# Patient Record
Sex: Female | Born: 1985 | Hispanic: Yes | Marital: Married | Smoking: Former smoker
Health system: Southern US, Community
[De-identification: ages and names within clinical notes are randomized; demographics above are authoritative.]

## PROBLEM LIST (undated history)

## (undated) DIAGNOSIS — B009 Herpesviral infection, unspecified: Secondary | ICD-10-CM

## (undated) DIAGNOSIS — Z789 Other specified health status: Secondary | ICD-10-CM

## (undated) DIAGNOSIS — I499 Cardiac arrhythmia, unspecified: Secondary | ICD-10-CM

## (undated) DIAGNOSIS — F419 Anxiety disorder, unspecified: Secondary | ICD-10-CM

## (undated) HISTORY — PX: TONSILLECTOMY: SUR1361

---
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
## 2007-03-10 ENCOUNTER — Emergency Department (HOSPITAL_COMMUNITY): Admission: EM | Admit: 2007-03-10 | Discharge: 2007-03-10 | Payer: Self-pay | Admitting: *Deleted

## 2007-08-25 ENCOUNTER — Inpatient Hospital Stay (HOSPITAL_COMMUNITY): Admission: AD | Admit: 2007-08-25 | Discharge: 2007-08-25 | Payer: Self-pay | Admitting: Gynecology

## 2007-11-17 ENCOUNTER — Ambulatory Visit: Payer: Self-pay | Admitting: Family Medicine

## 2007-11-17 ENCOUNTER — Inpatient Hospital Stay (HOSPITAL_COMMUNITY): Admission: AD | Admit: 2007-11-17 | Discharge: 2007-11-17 | Payer: Self-pay | Admitting: Gynecology

## 2007-12-25 ENCOUNTER — Inpatient Hospital Stay (HOSPITAL_COMMUNITY): Admission: AD | Admit: 2007-12-25 | Discharge: 2007-12-25 | Payer: Self-pay | Admitting: Family Medicine

## 2007-12-25 ENCOUNTER — Ambulatory Visit: Payer: Self-pay | Admitting: Physician Assistant

## 2008-08-28 ENCOUNTER — Emergency Department (HOSPITAL_COMMUNITY): Admission: EM | Admit: 2008-08-28 | Discharge: 2008-08-28 | Payer: Self-pay | Admitting: Emergency Medicine

## 2008-09-22 ENCOUNTER — Emergency Department (HOSPITAL_COMMUNITY): Admission: EM | Admit: 2008-09-22 | Discharge: 2008-09-22 | Payer: Self-pay | Admitting: Emergency Medicine

## 2008-10-18 ENCOUNTER — Emergency Department (HOSPITAL_COMMUNITY): Admission: EM | Admit: 2008-10-18 | Discharge: 2008-10-18 | Payer: Self-pay | Admitting: Emergency Medicine

## 2009-03-16 ENCOUNTER — Inpatient Hospital Stay (HOSPITAL_COMMUNITY): Admission: AD | Admit: 2009-03-16 | Discharge: 2009-03-16 | Payer: Self-pay | Admitting: Family Medicine

## 2009-08-07 ENCOUNTER — Emergency Department (HOSPITAL_COMMUNITY): Admission: EM | Admit: 2009-08-07 | Discharge: 2009-08-07 | Payer: Self-pay | Admitting: Emergency Medicine

## 2009-11-28 ENCOUNTER — Emergency Department (HOSPITAL_COMMUNITY): Admission: EM | Admit: 2009-11-28 | Discharge: 2009-11-28 | Payer: Self-pay | Admitting: Emergency Medicine

## 2010-05-27 LAB — URINE MICROSCOPIC-ADD ON

## 2010-05-27 LAB — WET PREP, GENITAL
Trich, Wet Prep: NONE SEEN
Yeast Wet Prep HPF POC: NONE SEEN

## 2010-05-27 LAB — URINALYSIS, ROUTINE W REFLEX MICROSCOPIC
Glucose, UA: NEGATIVE mg/dL
Protein, ur: NEGATIVE mg/dL

## 2010-05-27 LAB — GC/CHLAMYDIA PROBE AMP, GENITAL: Chlamydia, DNA Probe: NEGATIVE

## 2010-06-16 LAB — GLUCOSE, CAPILLARY

## 2010-07-27 NOTE — Op Note (Signed)
[place]  Patient:    Susan Burgess, Susan Burgess Visit Number: [id number] MRN: [id number]          Service Type: DSU Location: [facility] Attending Physician:  Aundria Mems Dictated by:   Kathy Breach, M.D. Proc. Date: 11/26/00 Admit Date:  11/26/2000                             Operative Report  PREOPERATIVE DIAGNOSIS:  Long-standing eustachian tube dysfunction with middle ear effusion and conductive hearing loss.  PROCEDURE:  Bilateral myringotomy insertion of T-tubes.  POSTOPERATIVE DIAGNOSIS:  Long-standing eustachian tube dysfunction with middle ear effusion and conductive hearing loss.  DESCRIPTION OF PROCEDURE:  The patient had cotton ball in her right ear with drainage from her ear.  Parents stated they had gone to the emergency room in [facility] over the weekend with fever and ear drainage, given Augmentin and Cortisporin drops.  The right ear examination with operating microscope, cloudy mucoid fluid in the ear canal with a lot of maceration of the canal skin and tympanic membrane surface which was completely clean was suctioned. This revealed she had a 1 mm round perforation anterior and inferior quadrant, middle ear space.  Able to suction, and a little more mucoid fluid through it. T-tube was inserted in this perforation site.  Floxin drops were displaced by pneumatic pressure, requiring some effort in pressure to demonstrate retrograde patency of the eustachian tube.  The left ear was then inspected. There was an atrophic anterior inferior portion of the tympanic membrane bulging outward.  Radial myringotomy incision was made through this, and a cloudy mucoid fluid expressed from the ear.  This was thoroughly aspirated clear.  T-tube was inserted in the myringotomy site anterior inferior quadrant.  Floxin drops again displaced readily, demonstrated retrograde patency of the eustachian tube.  The patient tolerated the procedure well,  was taken to the recovery room in stable general condition. Dictated by:   Kathy Breach, M.D. Attending Physician:  Aundria Mems DD:  11/26/00 TD:  11/26/00 Job: [id number]
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
## 2010-08-25 ENCOUNTER — Inpatient Hospital Stay (HOSPITAL_COMMUNITY)
Admission: AD | Admit: 2010-08-25 | Discharge: 2010-08-25 | Disposition: A | Discharge status: Home / Self Care | Payer: Medicaid Other | Source: Ambulatory Visit | Attending: Obstetrics & Gynecology | Admitting: Obstetrics & Gynecology

## 2010-08-25 DIAGNOSIS — O9989 Other specified diseases and conditions complicating pregnancy, childbirth and the puerperium: Secondary | ICD-10-CM

## 2010-08-25 DIAGNOSIS — O99891 Other specified diseases and conditions complicating pregnancy: Secondary | ICD-10-CM | POA: Insufficient documentation

## 2010-08-25 DIAGNOSIS — K1370 Unspecified lesions of oral mucosa: Secondary | ICD-10-CM | POA: Insufficient documentation

## 2010-12-06 LAB — POCT PREGNANCY, URINE: Preg Test, Ur: POSITIVE

## 2010-12-06 LAB — URINALYSIS, ROUTINE W REFLEX MICROSCOPIC
Bilirubin Urine: NEGATIVE
Glucose, UA: NEGATIVE
Leukocytes, UA: NEGATIVE
Nitrite: NEGATIVE
Protein, ur: NEGATIVE
pH: 6.5

## 2010-12-06 LAB — WET PREP, GENITAL
Clue Cells Wet Prep HPF POC: NONE SEEN
Trich, Wet Prep: NONE SEEN
Yeast Wet Prep HPF POC: NONE SEEN

## 2010-12-06 LAB — CBC
Platelets: 196
RDW: 12.3

## 2010-12-06 LAB — ABO/RH: ABO/RH(D): O POS

## 2010-12-06 LAB — URINE MICROSCOPIC-ADD ON

## 2010-12-06 LAB — GC/CHLAMYDIA PROBE AMP, GENITAL: Chlamydia, DNA Probe: NEGATIVE

## 2010-12-06 LAB — HCG, QUANTITATIVE, PREGNANCY: hCG, Beta Chain, Quant, S: 81871 — ABNORMAL HIGH

## 2010-12-10 LAB — URINALYSIS, ROUTINE W REFLEX MICROSCOPIC
Nitrite: NEGATIVE
Protein, ur: NEGATIVE

## 2010-12-12 LAB — URINALYSIS, ROUTINE W REFLEX MICROSCOPIC
Ketones, ur: NEGATIVE
Protein, ur: NEGATIVE
Specific Gravity, Urine: 1.025
Urobilinogen, UA: 0.2

## 2010-12-12 LAB — WET PREP, GENITAL: Trich, Wet Prep: NONE SEEN

## 2010-12-12 LAB — URINE MICROSCOPIC-ADD ON: RBC / HPF: NONE SEEN

## 2010-12-12 LAB — GC/CHLAMYDIA PROBE AMP, GENITAL: GC Probe Amp, Genital: NEGATIVE

## 2011-08-07 ENCOUNTER — Emergency Department (HOSPITAL_COMMUNITY)
Admission: EM | Admit: 2011-08-07 | Discharge: 2011-08-07 | Disposition: A | Discharge status: Home / Self Care | Payer: Medicaid Other | Attending: Emergency Medicine | Admitting: Emergency Medicine

## 2011-08-07 DIAGNOSIS — Z760 Encounter for issue of repeat prescription: Secondary | ICD-10-CM | POA: Insufficient documentation

## 2011-08-07 MED ORDER — VALACYCLOVIR HCL 500 MG PO TABS: 500.0000 mg | ORAL_TABLET | Freq: Two times a day (BID) | ORAL | Status: DC

## 2011-08-07 NOTE — ED Notes (Signed)
Pt needs refill on valacyclovir 500mg  tablets.  Pt reports getting irritation around her lips each summer and needs medication to help suppress symptoms.

## 2011-08-07 NOTE — Discharge Instructions (Signed)
Medication Refill, Emergency Department  We have refilled your medication today as a courtesy to you. It is best for your medical care, however, to take care of getting refills done through your primary caregiver's office. They have your records and can do a better job of follow-up than we can in the emergency department.  On maintenance medications, we often only prescribe enough medications to get you by until you are able to see your regular caregiver. This is a more expensive way to refill medications.  In the future, please plan for refills so that you will not have to use the emergency department for this.  Thank you for your help. Your help allows us to better take care of the daily emergencies that enter our department.  Document Released: 06/14/2003 Document Revised: 02/14/2011 Document Reviewed: 02/25/2005  Exit[employer] Patient Information 2012 [employer].

## 2011-08-07 NOTE — ED Provider Notes (Signed)
History     CSN: [id number]  Arrival date & time 08/07/11  1155   First MD Initiated Contact with Patient 08/07/11 1222      Chief Complaint  Patient presents with  . Medication Refill    (Consider location/radiation/quality/duration/timing/severity/associated sxs/prior treatment) HPI Comments: 76 y who presents for medication refill.  Pt gets outbreak around lips every summer, and the valtrex she takes to suppress is out.  No other complaints, no pain or rash, or fever.  The history is provided by the patient. No language interpreter was used.    No past medical history on file.  No past surgical history on file.  No family history on file.  History  Substance Use Topics  . Smoking status: Not on file  . Smokeless tobacco: Not on file  . Alcohol Use: Not on file    OB History    No data available      Review of Systems  All other systems reviewed and are negative.    Allergies  Review of patient's allergies indicates no known allergies.  Home Medications   Current Outpatient Rx  Name Route Sig Dispense Refill  . VALACYCLOVIR HCL 500 MG PO TABS Oral Take 1 tablet (500 mg total) by mouth 2 (two) times daily. 10 tablet 2    BP 112/70  Pulse 64  Temp(Src) 98.2 F (36.8 C) (Oral)  SpO2 100%  Physical Exam  Nursing note and vitals reviewed. Constitutional: She appears well-developed and well-nourished.  HENT:  Head: Normocephalic.  Eyes: Conjunctivae and EOM are normal.  Neck: Normal range of motion. Neck supple.  Cardiovascular: Normal rate and normal heart sounds.   Pulmonary/Chest: Effort normal and breath sounds normal.  Abdominal: Soft. Bowel sounds are normal.  Neurological: She is alert.  Skin: Skin is warm.    ED Course  Procedures (including critical care time)  Labs Reviewed - No data to display No results found.   1. Medication refill       MDM  Pt here for refill of medication.  No adverse reactions noted before.  Will  refill med.          Chrystine Oiler, MD 08/07/11 1251

## 2011-11-09 ENCOUNTER — Inpatient Hospital Stay (HOSPITAL_COMMUNITY)
Admission: AD | Admit: 2011-11-09 | Discharge: 2011-11-09 | Disposition: A | Discharge status: Home / Self Care | Payer: Self-pay | Source: Ambulatory Visit | Attending: Obstetrics & Gynecology | Admitting: Obstetrics & Gynecology

## 2011-11-09 ENCOUNTER — Encounter (HOSPITAL_COMMUNITY): Payer: Self-pay

## 2011-11-09 ENCOUNTER — Inpatient Hospital Stay (HOSPITAL_COMMUNITY): Payer: Self-pay

## 2011-11-09 DIAGNOSIS — R102 Pelvic and perineal pain: Secondary | ICD-10-CM

## 2011-11-09 DIAGNOSIS — Z30432 Encounter for removal of intrauterine contraceptive device: Secondary | ICD-10-CM | POA: Insufficient documentation

## 2011-11-09 DIAGNOSIS — N949 Unspecified condition associated with female genital organs and menstrual cycle: Secondary | ICD-10-CM | POA: Insufficient documentation

## 2011-11-09 DIAGNOSIS — R109 Unspecified abdominal pain: Secondary | ICD-10-CM | POA: Insufficient documentation

## 2011-11-09 HISTORY — DX: Other specified health status: Z78.9

## 2011-11-09 LAB — URINALYSIS, ROUTINE W REFLEX MICROSCOPIC
Glucose, UA: NEGATIVE mg/dL
Ketones, ur: NEGATIVE mg/dL
Protein, ur: NEGATIVE mg/dL
Specific Gravity, Urine: 1.02 (ref 1.005–1.030)
Urobilinogen, UA: 0.2 mg/dL (ref 0.0–1.0)
pH: 7 (ref 5.0–8.0)

## 2011-11-09 LAB — WET PREP, GENITAL: Trich, Wet Prep: NONE SEEN

## 2011-11-09 MED ORDER — MEDROXYPROGESTERONE ACETATE 150 MG/ML IM SUSP
150.0000 mg | Freq: Once | INTRAMUSCULAR | Status: AC
  Administered 2011-11-09: 150 mg via INTRAMUSCULAR
  Filled 2011-11-09: qty 1

## 2011-11-09 NOTE — MAU Note (Signed)
Patient states she would like to have the Mirena out so she can go back on Depo

## 2011-11-09 NOTE — MAU Note (Signed)
Patient states she has had a Mirena IUD since last October after the birth of her baby. States for about one week she has been having abdominal pain, vaginal pain, nausea, and side pain. Denies any bleeding at this time, passed a clot 1 1/.2 weeks ago. Patient is still breast feeding.

## 2011-11-09 NOTE — MAU Provider Note (Signed)
History     CSN: [id number]  Arrival date and time: 11/09/11 1539   First Provider Initiated Contact with Patient 11/09/11 1625      Chief Complaint  Patient presents with  . Abdominal Pain  . Nausea  . Chills  . Vaginal Pain  . Possible Pregnancy   HPI 26 y.o. G3P3 with pelvic pain and vaginal discomfort. Has mirena in place since October 2012. No bleeding or discharge. Wants Mirena out, wants to restart depo provera. Feels like the Mirena is causing her pain.    Past Medical History  Diagnosis Date  . No pertinent past medical history     Past Surgical History  Procedure Date  . Tonsillectomy     No family history on file.  History  Substance Use Topics  . Smoking status: Former Games developer  . Smokeless tobacco: Not on file  . Alcohol Use: Yes     occasional    Allergies: No Known Allergies  Prescriptions prior to admission  Medication Sig Dispense Refill  . valACYclovir (VALTREX) 500 MG tablet Take 1 tablet (500 mg total) by mouth 2 (two) times daily.  10 tablet  2    Review of Systems  Constitutional: Negative.   Respiratory: Negative.   Cardiovascular: Negative.   Gastrointestinal: Positive for abdominal pain. Negative for nausea, vomiting, diarrhea and constipation.  Genitourinary: Negative for dysuria, urgency, frequency, hematuria and flank pain.       Negative for vaginal bleeding, vaginal discharge, dyspareunia  Musculoskeletal: Negative.   Neurological: Negative.   Psychiatric/Behavioral: Negative.    Physical Exam   Blood pressure 116/73, pulse 93, temperature 98.7 F (37.1 C), temperature source Oral, resp. rate 16, height 5\' 1"  (1.549 m), weight 171 lb (77.565 kg), SpO2 99.00%.  Physical Exam  Nursing note and vitals reviewed. Constitutional: She is oriented to person, place, and time. She appears well-developed and well-nourished. No distress.  HENT:  Head: Normocephalic and atraumatic.  Cardiovascular: Normal rate, regular rhythm and  normal heart sounds.   Respiratory: Effort normal and breath sounds normal. No respiratory distress.  GI: Soft. Bowel sounds are normal. She exhibits no distension and no mass. There is no tenderness. There is no rebound and no guarding.  Genitourinary: There is no rash or lesion on the right labia. There is no rash or lesion on the left labia. Uterus is not deviated, not enlarged, not fixed and not tender. Cervix exhibits no motion tenderness, no discharge and no friability. Right adnexum displays no mass, no tenderness and no fullness. Left adnexum displays tenderness. Left adnexum displays no mass and no fullness. No erythema, tenderness or bleeding around the vagina. No vaginal discharge found.       IUD strings visible protruding from cervical os, with patient's verbal consent, strings grasped with ring forceps and IUD removed without difficulty, patient tolerated well  Neurological: She is alert and oriented to person, place, and time.  Skin: Skin is warm and dry.  Psychiatric: She has a normal mood and affect.    MAU Course  Procedures Results for orders placed during the hospital encounter of 11/09/11 (from the past 24 hour(s))  POCT PREGNANCY, URINE     Status: Normal   Collection Time   11/09/11  4:02 PM      Component Value Range   Preg Test, Ur NEGATIVE  NEGATIVE   US Transvaginal Non-ob  11/09/2011  *RADIOLOGY REPORT*  Clinical Data: Right adnexal and right lower quadrant pain and tenderness; IUD removed today  TRANSABDOMINAL AND TRANSVAGINAL ULTRASOUND OF PELVIS Technique:  Both transabdominal and transvaginal ultrasound examinations of the pelvis were performed. Transabdominal technique was performed for global imaging of the pelvis including uterus, ovaries, adnexal regions, and pelvic cul-de-sac.  It was necessary to proceed with endovaginal exam following the transabdominal exam to visualize the endometrium and ovaries.  Comparison:  03/16/2009  Findings:  Uterus: 5.8 cm length  by 3.3 cm AP by 3.3 cm transverse. Retroverted.  Normal morphology without mass.  Endometrium: 2 mm thick, normal.  No endometrial fluid.  Right ovary:  2.8 x 2.1 x 1.6 cm.  Normal morphology without mass.  Left ovary: 2.4 x 1.3 x 2.2 cm.  Normal morphology without mass.  Other findings: No free pelvic fluid or adnexal masses identified.  IMPRESSION: Normal exam.   Original Report Authenticated By: Lollie Marrow, M.D.    US Pelvis Complete  11/09/2011  *RADIOLOGY REPORT*  Clinical Data: Right adnexal and right lower quadrant pain and tenderness; IUD removed today  TRANSABDOMINAL AND TRANSVAGINAL ULTRASOUND OF PELVIS Technique:  Both transabdominal and transvaginal ultrasound examinations of the pelvis were performed. Transabdominal technique was performed for global imaging of the pelvis including uterus, ovaries, adnexal regions, and pelvic cul-de-sac.  It was necessary to proceed with endovaginal exam following the transabdominal exam to visualize the endometrium and ovaries.  Comparison:  03/16/2009  Findings:  Uterus: 5.8 cm length by 3.3 cm AP by 3.3 cm transverse. Retroverted.  Normal morphology without mass.  Endometrium: 2 mm thick, normal.  No endometrial fluid.  Right ovary:  2.8 x 2.1 x 1.6 cm.  Normal morphology without mass.  Left ovary: 2.4 x 1.3 x 2.2 cm.  Normal morphology without mass.  Other findings: No free pelvic fluid or adnexal masses identified.  IMPRESSION: Normal exam.   Original Report Authenticated By: Lollie Marrow, M.D.      Assessment and Plan  26 y.o. G3P3 with pelvic pain IUD removed per patient request, Depo Provera given in MAU today Follow up for repeat Depo shot in 3 months  Susan Burgess 11/09/2011, 4:27 PM

## 2011-11-12 LAB — GC/CHLAMYDIA PROBE AMP, GENITAL
Chlamydia, DNA Probe: NEGATIVE
GC Probe Amp, Genital: NEGATIVE

## 2012-03-07 ENCOUNTER — Emergency Department (HOSPITAL_COMMUNITY)
Admission: EM | Admit: 2012-03-07 | Discharge: 2012-03-08 | Disposition: A | Discharge status: Home / Self Care | Payer: Medicaid Other | Attending: Emergency Medicine | Admitting: Emergency Medicine

## 2012-03-07 ENCOUNTER — Encounter (HOSPITAL_COMMUNITY): Payer: Self-pay | Admitting: *Deleted

## 2012-03-07 DIAGNOSIS — R6883 Chills (without fever): Secondary | ICD-10-CM | POA: Insufficient documentation

## 2012-03-07 DIAGNOSIS — J029 Acute pharyngitis, unspecified: Secondary | ICD-10-CM | POA: Insufficient documentation

## 2012-03-07 DIAGNOSIS — Z87891 Personal history of nicotine dependence: Secondary | ICD-10-CM | POA: Insufficient documentation

## 2012-03-07 DIAGNOSIS — R059 Cough, unspecified: Secondary | ICD-10-CM | POA: Insufficient documentation

## 2012-03-07 DIAGNOSIS — R109 Unspecified abdominal pain: Secondary | ICD-10-CM | POA: Insufficient documentation

## 2012-03-07 DIAGNOSIS — B349 Viral infection, unspecified: Secondary | ICD-10-CM

## 2012-03-07 DIAGNOSIS — R002 Palpitations: Secondary | ICD-10-CM | POA: Insufficient documentation

## 2012-03-07 DIAGNOSIS — R05 Cough: Secondary | ICD-10-CM | POA: Insufficient documentation

## 2012-03-07 DIAGNOSIS — R197 Diarrhea, unspecified: Secondary | ICD-10-CM | POA: Insufficient documentation

## 2012-03-07 LAB — CBC WITH DIFFERENTIAL/PLATELET
Basophils Absolute: 0 10*3/uL (ref 0.0–0.1)
Basophils Relative: 0 % (ref 0–1)
Eosinophils Absolute: 0.1 10*3/uL (ref 0.0–0.7)
Hemoglobin: 14.5 g/dL (ref 12.0–15.0)
MCHC: 33 g/dL (ref 30.0–36.0)
Monocytes Relative: 9 % (ref 3–12)
Neutro Abs: 7.3 10*3/uL (ref 1.7–7.7)
Neutrophils Relative %: 81 % — ABNORMAL HIGH (ref 43–77)
Platelets: 234 10*3/uL (ref 150–400)

## 2012-03-07 LAB — URINE MICROSCOPIC-ADD ON

## 2012-03-07 LAB — COMPREHENSIVE METABOLIC PANEL
ALT: 25 U/L (ref 0–35)
AST: 22 U/L (ref 0–37)
Albumin: 4.5 g/dL (ref 3.5–5.2)
Alkaline Phosphatase: 113 U/L (ref 39–117)
BUN: 13 mg/dL (ref 6–23)
Chloride: 98 mEq/L (ref 96–112)
Potassium: 4 mEq/L (ref 3.5–5.1)
Sodium: 134 mEq/L — ABNORMAL LOW (ref 135–145)
Total Bilirubin: 0.2 mg/dL — ABNORMAL LOW (ref 0.3–1.2)
Total Protein: 8.3 g/dL (ref 6.0–8.3)

## 2012-03-07 LAB — URINALYSIS, ROUTINE W REFLEX MICROSCOPIC
Bilirubin Urine: NEGATIVE
Glucose, UA: NEGATIVE mg/dL
Ketones, ur: NEGATIVE mg/dL
Nitrite: NEGATIVE
Specific Gravity, Urine: 1.027 (ref 1.005–1.030)
pH: 6 (ref 5.0–8.0)

## 2012-03-07 MED ORDER — SODIUM CHLORIDE 0.9 % IV BOLUS (SEPSIS)
1000.0000 mL | Freq: Once | INTRAVENOUS | Status: AC
  Administered 2012-03-07: 1000 mL via INTRAVENOUS

## 2012-03-07 NOTE — ED Provider Notes (Signed)
History     CSN: [id number]  Arrival date & time 03/07/12  2144   First MD Initiated Contact with Patient 03/07/12 2358      Chief Complaint  Patient presents with  . multiple  complaints      Patient is a 26 y.o. female presenting with vomiting. The history is provided by the patient.  Emesis  This is a new problem. The current episode started yesterday. The problem occurs 2 to 4 times per day. The problem has been gradually worsening. The emesis has an appearance of stomach contents. There has been no fever. Associated symptoms include abdominal pain, chills, cough and diarrhea.  pt reports cough, vomiting, diarrhea (nonbloody) for past day She also reports headache and body aches. She also reports sore throat She also feels palpitations - she reports she has felt that previously but no syncope reported.  She also reports long h/o (over one year) brief left sided CP that comes at random and is not particularly pleuritic or exertional.  She is not having any active CP at this time.  Past Medical History  Diagnosis Date  . No pertinent past medical history     Past Surgical History  Procedure Date  . Tonsillectomy     No family history on file.  History  Substance Use Topics  . Smoking status: Former Games developer  . Smokeless tobacco: Not on file  . Alcohol Use: Yes     Comment: occasional    OB History    Grav Para Term Preterm Abortions TAB SAB Ect Mult Living   3 3        3       Review of Systems  Constitutional: Positive for chills.  Respiratory: Positive for cough.   Gastrointestinal: Positive for vomiting, abdominal pain and diarrhea.  All other systems reviewed and are negative.    Allergies  Review of patient's allergies indicates no known allergies.  Home Medications  No current outpatient prescriptions on file.  BP 120/82  Pulse 140  Temp 98.4 F (36.9 C) (Oral)  Resp 20  SpO2 98% HR improves to 120s while I am in room Physical  Exam CONSTITUTIONAL: Well developed/well nourished HEAD AND FACE: Normocephalic/atraumatic EYES: EOMI/PERRL ENMT: Mucous membranes moist NECK: supple no meningeal signs SPINE:entire spine nontender CV: S1/S2 noted, no murmurs/rubs/gallops noted Chest - mildly tender to palpation, no crepitance LUNGS: Lungs are clear to auscultation bilaterally, no apparent distress ABDOMEN: soft, nontender, no rebound or guarding GU:no cva tenderness NEURO: Pt is awake/alert, moves all extremitiesx4 EXTREMITIES: pulses normal, full ROM SKIN: warm, color normal PSYCH: no abnormalities of mood noted  ED Course  Procedures  Labs Reviewed  CBC WITH DIFFERENTIAL - Abnormal; Notable for the following:    Neutrophils Relative 81 (*)     Lymphocytes Relative 9 (*)     All other components within normal limits  COMPREHENSIVE METABOLIC PANEL - Abnormal; Notable for the following:    Sodium 134 (*)     Glucose, Bld 110 (*)     Total Bilirubin 0.2 (*)     All other components within normal limits  URINALYSIS, ROUTINE W REFLEX MICROSCOPIC - Abnormal; Notable for the following:    Hgb urine dipstick MODERATE (*)     All other components within normal limits  URINE MICROSCOPIC-ADD ON - Abnormal; Notable for the following:    Squamous Epithelial / LPF FEW (*)     All other components within normal limits  TROPONIN I  PREGNANCY, URINE  12:19 AM Pt reporting vomiting/diarrhea/cough for past day.  Will rehydrate and give IV fluids.  EKG shows sinus tach.  Will obtain CXR as well.    1:47 AM Heart rate improved.  Pt well appearing,  Stable for d/c  MDM  Nursing notes including past medical history and social history reviewed and considered in documentation xrays reviewed and considered Labs/vital reviewed and considered        Date: 03/07/2012  Rate: 139  Rhythm: sinus tachycardia  QRS Axis: normal  Intervals: normal  ST/T Wave abnormalities: nonspecific ST changes  Conduction  Disutrbances:none    Joya Gaskins, MD 03/08/12 0148

## 2012-03-07 NOTE — ED Notes (Signed)
The pt has had tachycardia in the past but was never worked for  It. p 144

## 2012-03-07 NOTE — ED Notes (Signed)
All day today she has felt very ill headache chills aching all  Over  n v and diarrhea.  Pressure in frontal  Head coughing throat painful.  lmp  2008

## 2012-03-08 ENCOUNTER — Emergency Department (HOSPITAL_COMMUNITY): Payer: Medicaid Other

## 2012-03-08 MED ORDER — SODIUM CHLORIDE 0.9 % IV BOLUS (SEPSIS)
1000.0000 mL | Freq: Once | INTRAVENOUS | Status: AC
  Administered 2012-03-08: 1000 mL via INTRAVENOUS

## 2012-03-09 ENCOUNTER — Ambulatory Visit: Payer: Medicaid Other | Admitting: Obstetrics and Gynecology

## 2012-05-29 ENCOUNTER — Inpatient Hospital Stay (HOSPITAL_COMMUNITY)
Admission: AD | Admit: 2012-05-29 | Discharge: 2012-05-29 | Disposition: A | Discharge status: Home / Self Care | Payer: Medicaid Other | Source: Ambulatory Visit | Attending: Obstetrics & Gynecology | Admitting: Obstetrics & Gynecology

## 2012-05-29 ENCOUNTER — Encounter (HOSPITAL_COMMUNITY): Payer: Self-pay | Admitting: *Deleted

## 2012-05-29 DIAGNOSIS — N926 Irregular menstruation, unspecified: Secondary | ICD-10-CM

## 2012-05-29 DIAGNOSIS — R109 Unspecified abdominal pain: Secondary | ICD-10-CM | POA: Insufficient documentation

## 2012-05-29 DIAGNOSIS — N912 Amenorrhea, unspecified: Secondary | ICD-10-CM | POA: Insufficient documentation

## 2012-05-29 HISTORY — DX: Herpesviral infection, unspecified: B00.9

## 2012-05-29 LAB — URINE MICROSCOPIC-ADD ON

## 2012-05-29 LAB — URINALYSIS, ROUTINE W REFLEX MICROSCOPIC
Glucose, UA: NEGATIVE mg/dL
Ketones, ur: NEGATIVE mg/dL
Leukocytes, UA: NEGATIVE
Protein, ur: NEGATIVE mg/dL
Urobilinogen, UA: 0.2 mg/dL (ref 0.0–1.0)

## 2012-05-29 LAB — POCT PREGNANCY, URINE: Preg Test, Ur: NEGATIVE

## 2012-05-29 MED ORDER — NORGESTIMATE-ETH ESTRADIOL 0.25-35 MG-MCG PO TABS: 1.0000 | ORAL_TABLET | Freq: Every day | ORAL | Status: DC

## 2012-05-29 NOTE — MAU Note (Signed)
States she has not had a period since she had her last child 6 years ago. Hx Mirena until September 2013. Started Depo the same day mirena removed. Did not continue Depo after first 3 months. No birth control since December 2013. States she gets cramping every month. States she is cramping today and thought she should get "checked out"  for why she does not have periods. Went to [facility] for pregnancies. No doctor now.

## 2012-05-29 NOTE — MAU Provider Note (Signed)
History     CSN: [id number]  Arrival date and time: 05/29/12 1440   First Provider Initiated Contact with Patient 05/29/12 1528      Chief Complaint  Patient presents with  . Abdominal Pain   HPI Ms. Susan Burgess is a 27 y.o. [id number] who presents to MAU today for amenorrhea. The patient states that her first pregnancy was in 2008 and prior to that she had regular periods. She states that between the next two pregnancies she did not ever have a cycle, but still got pregnant twice. After her last child was born she had an IUD placed. After about 1 year the patient states that she was having abdominal pain and bloating from the IUD. She had it removed in 10/2011 and received a Depo provera injection the same day for birth control. Her next Depo injection was due around 02/09/12, but she was unable to get the injection. She has not been on birth control of any kind since. She states that she is concerned that she has not had a period. She does have some cramping and bloating and occasional nausea around the time that her cycle would be, but without any bleeding. She denies abnormal vaginal discharge, vaginal bleeding of any kind or abdominal pain aside from the cyclic cramping. She is still breastfeeding although she has been weaning her daughter from breastfeeding for a while and has had significant decrease in milk production.   OB History   Grav Para Term Preterm Abortions TAB SAB Ect Mult Living   3 3        3       Past Medical History  Diagnosis Date  . No pertinent past medical history   . Herpes simplex     "around her mouth"    Past Surgical History  Procedure Laterality Date  . Tonsillectomy      Family History  Problem Relation Age of Onset  . Diabetes Maternal Aunt   . Diabetes Maternal Grandfather     History  Substance Use Topics  . Smoking status: Former Games developer  . Smokeless tobacco: Not on file  . Alcohol Use: Yes     Comment: occasional    Allergies: No  Known Allergies  No prescriptions prior to admission    Review of Systems  Constitutional: Negative for fever and malaise/fatigue.  Gastrointestinal: Positive for abdominal pain. Negative for nausea, vomiting, diarrhea and constipation.  Genitourinary: Negative for dysuria, urgency and frequency.  Musculoskeletal: Negative for back pain.  Neurological: Negative for dizziness.   Physical Exam   Blood pressure 114/65, pulse 87, temperature 98.7 F (37.1 C), temperature source Oral, resp. rate 16, height 5\' 1"  (1.549 m), weight 177 lb 9.6 oz (80.559 kg), SpO2 99.00%.  Physical Exam  Constitutional: She is oriented to person, place, and time. She appears well-developed and well-nourished.  HENT:  Head: Normocephalic and atraumatic.  Cardiovascular: Normal rate, regular rhythm and normal heart sounds.   Respiratory: Effort normal and breath sounds normal. No respiratory distress.  GI: Soft. Bowel sounds are normal. She exhibits no distension and no mass. There is no tenderness. There is no rebound and no guarding.  Neurological: She is alert and oriented to person, place, and time.  Skin: Skin is warm and dry. No erythema.  Psychiatric: She has a normal mood and affect.   Results for orders placed during the hospital encounter of 05/29/12 (from the past 24 hour(s))  URINALYSIS, ROUTINE W REFLEX MICROSCOPIC     Status:  Abnormal   Collection Time    05/29/12  2:50 PM      Result Value Range   Color, Urine YELLOW  YELLOW   APPearance CLEAR  CLEAR   Specific Gravity, Urine 1.025  1.005 - 1.030   pH 6.0  5.0 - 8.0   Glucose, UA NEGATIVE  NEGATIVE mg/dL   Hgb urine dipstick MODERATE (*) NEGATIVE   Bilirubin Urine NEGATIVE  NEGATIVE   Ketones, ur NEGATIVE  NEGATIVE mg/dL   Protein, ur NEGATIVE  NEGATIVE mg/dL   Urobilinogen, UA 0.2  0.0 - 1.0 mg/dL   Nitrite NEGATIVE  NEGATIVE   Leukocytes, UA NEGATIVE  NEGATIVE  URINE MICROSCOPIC-ADD ON     Status: Abnormal   Collection Time     05/29/12  2:50 PM      Result Value Range   Squamous Epithelial / LPF FEW (*) RARE   RBC / HPF 3-6  <3 RBC/hpf   Urine-Other YEAST    POCT PREGNANCY, URINE     Status: None   Collection Time    05/29/12  3:23 PM      Result Value Range   Preg Test, Ur NEGATIVE  NEGATIVE    MAU Course  Procedures None  MDM Patient plans to stop breastfeeding and has been cautioned to do so prior to starting the OCPs  Assessment and Plan  A: Amenorrhea, secondary to depo provera use  P: Discharge home Rx for Sprintec x 3 months sent to patient's pharmacy Patient will follow-up in GYN clinic in 2 months for follow-up. Labs may be necessary if patient has not resumed cycles while on OCPs and after cessation of breastfeeding Patient may return to MAU as needed or if her condition were to change or worsen  Freddi Starr, PA-C  05/29/2012, 3:31 PM

## 2012-07-08 ENCOUNTER — Ambulatory Visit (INDEPENDENT_AMBULATORY_CARE_PROVIDER_SITE_OTHER): Payer: Medicaid Other | Admitting: Obstetrics and Gynecology

## 2012-07-08 ENCOUNTER — Encounter: Payer: Self-pay | Admitting: Obstetrics and Gynecology

## 2012-07-08 VITALS — BP 123/82 | HR 80 | Temp 97.2°F | Resp 20 | Ht 61.0 in | Wt 179.6 lb

## 2012-07-08 DIAGNOSIS — B009 Herpesviral infection, unspecified: Secondary | ICD-10-CM | POA: Insufficient documentation

## 2012-07-08 DIAGNOSIS — R002 Palpitations: Secondary | ICD-10-CM

## 2012-07-08 DIAGNOSIS — Z30011 Encounter for initial prescription of contraceptive pills: Secondary | ICD-10-CM

## 2012-07-08 DIAGNOSIS — Z309 Encounter for contraceptive management, unspecified: Secondary | ICD-10-CM

## 2012-07-08 DIAGNOSIS — Z3009 Encounter for other general counseling and advice on contraception: Secondary | ICD-10-CM

## 2012-07-08 MED ORDER — VALACYCLOVIR HCL 500 MG PO TABS: 500.0000 mg | ORAL_TABLET | Freq: Two times a day (BID) | ORAL | Status: DC

## 2012-07-08 MED ORDER — NORGESTIMATE-ETH ESTRADIOL 0.25-35 MG-MCG PO TABS: 1.0000 | ORAL_TABLET | Freq: Every day | ORAL | Status: AC

## 2012-07-08 NOTE — Progress Notes (Signed)
Pt here for follow up of MAU visit on 05/29/12- check to see if OCP's are beneficial. Pt also has other concerns and questions about decreased libido and history of chest pain. States she was previously told to have cardiology referral but could not get appt because was told needed a referral. Pt also has back pain- she feels as a result of epidural from 2012 delivery of child.

## 2012-07-08 NOTE — Progress Notes (Signed)
CC: Referral     HPI Susan Burgess is a 27 y.o. [id number]  who presents for Followup of the MA U. Visit 05/29/2012 when she was seen for amenorrhea. At that time she had been breast-feeding her 79-month-old. She was on Depo-Provera until 02/27/2013 when she was last due for injection. In the MA U. She was placed on Sprintec and given a three-month supply. She is taken as directed and LMP was 06/22/2012. She has had no trouble remembering to take the pill and would like to continue. She is concerned about low libido. Denies relationship problems, dryness, dyspareunia. Does endorse stress and being very busy caring for her 3 children. She is concerned that she was told during her last pregnancy ([facility] in [facility]) that she had an irregular heartbeat. She does have longstanding hx of palpitations which are increased during exertion. She describes heart pounding, racing and feeling uncomfortable for several seconds; she also perceives missing beats intermittently. No chest pain or shortness of breath at present. She had an EKG at ED visit recently which revealed sinus tachycardia. She was told by her obstetrician to see a cardiologist, however she is needing a referral to do so. She requests refill on her Valtrex which she takes for fever blisters around her lips.  Past Medical History  Diagnosis Date  . No pertinent past medical history   . Herpes simplex     "around her mouth"    OB History   Grav Para Term Preterm Abortions TAB SAB Ect Mult Living   3 3 3       3      # Outc Date GA Lbr Len/2nd Wgt Sex Del Anes PTL Lv   1 TRM 6/08    F SVD EPI  Yes   2 TRM 12/09    F SVD EPI  Yes   3 TRM 9/12    F SVD EPI  Yes      Past Surgical History  Procedure Laterality Date  . Tonsillectomy      History   Social History  . Marital Status: Single    Spouse Name: N/A    Number of Children: N/A  . Years of Education: N/A   Occupational History  . Not on file.   Social History Main  Topics  . Smoking status: Former Games developer  . Smokeless tobacco: Not on file  . Alcohol Use: Yes     Comment: occasional  . Drug Use: No  . Sexually Active: Not Currently    Birth Control/ Protection: Pill     Comment: Last intercourse January   Other Topics Concern  . Not on file   Social History Narrative  . No narrative on file    No current outpatient prescriptions on file prior to visit.   No current facility-administered medications on file prior to visit.    No Known Allergies  ROS Pertinent items in HPI  PHYSICAL EXAM Filed Vitals:   07/08/12 1403  BP: 123/82  Pulse: 80  Temp: 97.2 F (36.2 C)  Resp: 20   General: Well nourished, well developed female in no acute distress Cardiovascular: Normal rate Respiratory: Normal effort Abdomen: Soft, nontender Back: No CVAT Extremities: No edema Neurologic: Alert and oriented Pelvic done 05/29/12 and not repeated  ASSESSMENT  1. Heart palpitations   2. Contraception management   3. Herpes simplex     PLAN Cards referral. Rx Sprintec and Valtres. Return here in 6 months for F/U and Pap.  See  AVS for patient education.    Medication List       These changes are accurate as of: 07/08/2012  2:48 PM. If you have any questions, ask your nurse or doctor.          TAKE these medications       norgestimate-ethinyl estradiol 0.25-35 MG-MCG tablet  Commonly known as:  ORTHO-CYCLEN,SPRINTEC,PREVIFEM  Take 1 tablet by mouth daily.     valACYclovir 500 MG tablet  Commonly known as:  VALTREX  Take 1 tablet (500 mg total) by mouth 2 (two) times daily.  Started by:  Danae Orleans, CNM          Breanna Mcdaniel Colin Mulders, CNM 07/08/2012 2:40 PM

## 2012-07-08 NOTE — Patient Instructions (Signed)

## 2012-09-25 ENCOUNTER — Encounter (HOSPITAL_COMMUNITY): Payer: Self-pay | Admitting: *Deleted

## 2012-09-25 ENCOUNTER — Inpatient Hospital Stay (HOSPITAL_COMMUNITY)
Admission: AD | Admit: 2012-09-25 | Discharge: 2012-09-25 | Disposition: A | Discharge status: Home / Self Care | Payer: Medicaid Other | Source: Ambulatory Visit | Attending: Obstetrics & Gynecology | Admitting: Obstetrics & Gynecology

## 2012-09-25 DIAGNOSIS — R3915 Urgency of urination: Secondary | ICD-10-CM | POA: Insufficient documentation

## 2012-09-25 DIAGNOSIS — N10 Acute tubulo-interstitial nephritis: Secondary | ICD-10-CM | POA: Insufficient documentation

## 2012-09-25 DIAGNOSIS — M549 Dorsalgia, unspecified: Secondary | ICD-10-CM | POA: Insufficient documentation

## 2012-09-25 HISTORY — DX: Cardiac arrhythmia, unspecified: I49.9

## 2012-09-25 LAB — URINALYSIS, ROUTINE W REFLEX MICROSCOPIC
Nitrite: NEGATIVE
Protein, ur: NEGATIVE mg/dL
Specific Gravity, Urine: 1.02 (ref 1.005–1.030)
Urobilinogen, UA: 0.2 mg/dL (ref 0.0–1.0)

## 2012-09-25 LAB — URINE MICROSCOPIC-ADD ON

## 2012-09-25 LAB — CBC
MCV: 86.4 fL (ref 78.0–100.0)
Platelets: 257 10*3/uL (ref 150–400)
RBC: 4.47 MIL/uL (ref 3.87–5.11)
RDW: 12.2 % (ref 11.5–15.5)
WBC: 10.8 10*3/uL — ABNORMAL HIGH (ref 4.0–10.5)

## 2012-09-25 LAB — POCT PREGNANCY, URINE: Preg Test, Ur: NEGATIVE

## 2012-09-25 MED ORDER — CEFTRIAXONE SODIUM 1 G IJ SOLR
1.0000 g | Freq: Once | INTRAMUSCULAR | Status: AC
  Administered 2012-09-25: 1 g via INTRAMUSCULAR
  Filled 2012-09-25: qty 10

## 2012-09-25 MED ORDER — ONDANSETRON 8 MG PO TBDP
8.0000 mg | ORAL_TABLET | Freq: Once | ORAL | Status: AC
  Administered 2012-09-25: 8 mg via ORAL
  Filled 2012-09-25: qty 1

## 2012-09-25 MED ORDER — ONDANSETRON 4 MG PO TBDP: 4.0000 mg | ORAL_TABLET | ORAL | Status: DC | PRN

## 2012-09-25 MED ORDER — OXYCODONE-ACETAMINOPHEN 5-325 MG PO TABS
2.0000 | ORAL_TABLET | Freq: Once | ORAL | Status: AC
  Administered 2012-09-25: 2 via ORAL
  Filled 2012-09-25: qty 2

## 2012-09-25 MED ORDER — CIPROFLOXACIN HCL 500 MG PO TABS: 500.0000 mg | ORAL_TABLET | Freq: Two times a day (BID) | ORAL | Status: DC

## 2012-09-25 MED ORDER — OXYCODONE-ACETAMINOPHEN 5-325 MG PO TABS: 1.0000 | ORAL_TABLET | ORAL | Status: DC | PRN

## 2012-09-25 MED ORDER — PHENAZOPYRIDINE HCL 100 MG PO TABS
200.0000 mg | ORAL_TABLET | Freq: Once | ORAL | Status: AC
  Administered 2012-09-25: 200 mg via ORAL
  Filled 2012-09-25: qty 2

## 2012-09-25 MED ORDER — PHENAZOPYRIDINE HCL 200 MG PO TABS: 200.0000 mg | ORAL_TABLET | Freq: Three times a day (TID) | ORAL | Status: DC | PRN

## 2012-09-25 NOTE — MAU Note (Addendum)
Pt reports R side back pain since yesterday that is getting progressively worse.

## 2012-09-25 NOTE — MAU Provider Note (Signed)
Chief Complaint: No chief complaint on file.   First Provider Initiated Contact with Patient 09/25/12 0217     SUBJECTIVE HPI: Susan Burgess is a 27 y.o. [id number] female who presents with moderate right mid-back pain since yesterday and urinary urgency and frequency today. Feels as if she almost won' t make it to the bathroom in time and then only urinates a small amount. Also reports mild  nausea, no vomiting. Denies fever, chills, vomiting, low abd pain, hematuria, vaginal discharge, vaginal bleeding. Has not tried anything for the pain.   Past Medical History  Diagnosis Date  . No pertinent past medical history   . 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 Dysrhythmia    OB History   Grav Para Term Preterm Abortions TAB SAB Ect Mult Living   3 3 3       3      # Outc Date GA Lbr Len/2nd Wgt Sex Del Anes PTL Lv   1 TRM 6/08    F SVD EPI  Yes   2 TRM 12/09    F SVD EPI  Yes   3 TRM 9/12    F SVD EPI  Yes     Past Surgical History  Procedure Laterality Date  . Tonsillectomy     History   Social History  . Marital Status: Married    Spouse Name: N/A    Number of Children: N/A  . Years of Education: N/A   Occupational History  . Not on file.   Social History Main Topics  . Smoking status: Former Games developer  . Smokeless tobacco: Not on file  . Alcohol Use: Yes     Comment: occasional  . Drug Use: No  . Sexually Active: Not Currently    Birth Control/ Protection: Pill     Comment: Last intercourse January   Other Topics Concern  . Not on file   Social History Narrative  . No narrative on file   No current facility-administered medications on file prior to encounter.   Current Outpatient Prescriptions on File Prior to Encounter  Medication Sig Dispense Refill  . norgestimate-ethinyl estradiol (ORTHO-CYCLEN,SPRINTEC,PREVIFEM) 0.25-35 MG-MCG tablet Take 1 tablet by mouth daily.  1 Package  11  . valACYclovir (VALTREX) 500 MG tablet Take 1 tablet (500 mg total) by  mouth 2 (two) times daily.  30 tablet  3   No Known Allergies  ROS: Pertinent items in HPI  OBJECTIVE Blood pressure 131/72, temperature 98.1 F (36.7 C), temperature source Oral, resp. rate 18, height 5' (1.524 m), weight 83.462 kg (184 lb). GENERAL: Well-developed, well-nourished female in mild distress, pacing.  HEENT: Normocephalic HEART: normal rate RESP: normal effort ABDOMEN: Soft, non-tender. BACK: Pos right CVAT.  EXTREMITIES: Nontender, no edema NEURO: Alert and oriented SPECULUM EXAM: Deferred.  LAB RESULTS Results for orders placed during the hospital encounter of 09/25/12 (from the past 24 hour(s))  URINALYSIS, ROUTINE W REFLEX MICROSCOPIC     Status: Abnormal   Collection Time    09/25/12  1:15 AM      Result Value Range   Color, Urine YELLOW  YELLOW   APPearance CLEAR  CLEAR   Specific Gravity, Urine 1.020  1.005 - 1.030   pH 7.0  5.0 - 8.0   Glucose, UA NEGATIVE  NEGATIVE mg/dL   Hgb urine dipstick MODERATE (*) NEGATIVE   Bilirubin Urine NEGATIVE  NEGATIVE   Ketones, ur NEGATIVE  NEGATIVE mg/dL   Protein, ur NEGATIVE  NEGATIVE mg/dL   Urobilinogen, UA 0.2  0.0 - 1.0 mg/dL   Nitrite NEGATIVE  NEGATIVE   Leukocytes, UA TRACE (*) NEGATIVE  URINE MICROSCOPIC-ADD ON     Status: Abnormal   Collection Time    09/25/12  1:15 AM      Result Value Range   Squamous Epithelial / LPF RARE  RARE   WBC, UA 3-6  <3 WBC/hpf   RBC / HPF 3-6  <3 RBC/hpf   Bacteria, UA MANY (*) RARE   Urine-Other MUCOUS PRESENT    CBC     Status: Abnormal   Collection Time    09/25/12  1:25 AM      Result Value Range   WBC 10.8 (*) 4.0 - 10.5 K/uL   RBC 4.47  3.87 - 5.11 MIL/uL   Hemoglobin 12.8  12.0 - 15.0 g/dL   HCT 16.1  09.6 - 04.5 %   MCV 86.4  78.0 - 100.0 fL   MCH 28.6  26.0 - 34.0 pg   MCHC 33.2  30.0 - 36.0 g/dL   RDW 40.9  81.1 - 91.4 %   Platelets 257  150 - 400 K/uL  POCT PREGNANCY, URINE     Status: None   Collection Time    09/25/12  1:43 AM      Result Value  Range   Preg Test, Ur NEGATIVE  NEGATIVE    IMAGING No results found.  MAU COURSE Rocephin 1 gm IM, Pyridium, Zofran and Percocet given in MAU. Pt able to keep down pills.   ASSESSMENT 1. Acute pyelonephritis     PLAN Discharge home in stable condition.      Follow-up Information   Follow up with Primary care provider. (As needed if no improvment in 2-3 days)       Follow up with Emergency department. (As needed for fever greater than 100.4 or if unable to keep down antibiotics.)        Medication List         ciprofloxacin 500 MG tablet  Commonly known as:  CIPRO  Take 1 tablet (500 mg total) by mouth 2 (two) times daily.     clonazePAM 0.5 MG tablet  Commonly known as:  KLONOPIN  Take 0.5 mg by mouth 2 (two) times daily as needed for anxiety.     norgestimate-ethinyl estradiol 0.25-35 MG-MCG tablet  Commonly known as:  ORTHO-CYCLEN,SPRINTEC,PREVIFEM  Take 1 tablet by mouth daily.     ondansetron 4 MG disintegrating tablet  Commonly known as:  ZOFRAN-ODT  Take 1 tablet (4 mg total) by mouth every 4 (four) hours as needed for nausea.     oxyCODONE-acetaminophen 5-325 MG per tablet  Commonly known as:  PERCOCET/ROXICET  Take 1-2 tablets by mouth every 4 (four) hours as needed for pain.     phenazopyridine 200 MG tablet  Commonly known as:  PYRIDIUM  Take 1 tablet (200 mg total) by mouth 3 (three) times daily as needed for pain.     valACYclovir 500 MG tablet  Commonly known as:  VALTREX  Take 1 tablet (500 mg total) by mouth 2 (two) times daily.       [place], CNM 09/25/2012  2:23 AM

## 2012-09-27 LAB — URINE CULTURE

## 2012-09-27 NOTE — MAU Provider Note (Signed)
Attestation of Attending Supervision of Advanced Practitioner (CNM/NP): Evaluation and management procedures were performed by the Advanced Practitioner under my supervision and collaboration. I have reviewed the Advanced Practitioner's note and chart, and I agree with the management and plan.  Susan Creed H. 9:03 PM

## 2012-10-01 ENCOUNTER — Emergency Department (HOSPITAL_COMMUNITY)
Admission: EM | Admit: 2012-10-01 | Discharge: 2012-10-02 | Disposition: A | Discharge status: Home / Self Care | Payer: Medicaid Other | Attending: Emergency Medicine | Admitting: Emergency Medicine

## 2012-10-01 ENCOUNTER — Inpatient Hospital Stay (EMERGENCY_DEPARTMENT_HOSPITAL)
Admission: AD | Admit: 2012-10-01 | Discharge: 2012-10-01 | Disposition: A | Discharge status: Home / Self Care | Payer: Medicaid Other | Source: Ambulatory Visit | Attending: Obstetrics & Gynecology | Admitting: Obstetrics & Gynecology

## 2012-10-01 ENCOUNTER — Encounter (HOSPITAL_COMMUNITY): Payer: Self-pay | Admitting: *Deleted

## 2012-10-01 ENCOUNTER — Emergency Department (HOSPITAL_COMMUNITY): Payer: Medicaid Other

## 2012-10-01 DIAGNOSIS — Z79899 Other long term (current) drug therapy: Secondary | ICD-10-CM | POA: Insufficient documentation

## 2012-10-01 DIAGNOSIS — F411 Generalized anxiety disorder: Secondary | ICD-10-CM | POA: Insufficient documentation

## 2012-10-01 DIAGNOSIS — Z3202 Encounter for pregnancy test, result negative: Secondary | ICD-10-CM | POA: Insufficient documentation

## 2012-10-01 DIAGNOSIS — Z8679 Personal history of other diseases of the circulatory system: Secondary | ICD-10-CM | POA: Insufficient documentation

## 2012-10-01 DIAGNOSIS — R319 Hematuria, unspecified: Secondary | ICD-10-CM | POA: Insufficient documentation

## 2012-10-01 DIAGNOSIS — R109 Unspecified abdominal pain: Secondary | ICD-10-CM | POA: Insufficient documentation

## 2012-10-01 DIAGNOSIS — Z8619 Personal history of other infectious and parasitic diseases: Secondary | ICD-10-CM | POA: Insufficient documentation

## 2012-10-01 DIAGNOSIS — N12 Tubulo-interstitial nephritis, not specified as acute or chronic: Secondary | ICD-10-CM | POA: Insufficient documentation

## 2012-10-01 DIAGNOSIS — Z87891 Personal history of nicotine dependence: Secondary | ICD-10-CM | POA: Insufficient documentation

## 2012-10-01 HISTORY — DX: Anxiety disorder, unspecified: F41.9

## 2012-10-01 LAB — CBC WITH DIFFERENTIAL/PLATELET
Basophils Absolute: 0 10*3/uL (ref 0.0–0.1)
Basophils Relative: 0 % (ref 0–1)
Basophils Relative: 0 % (ref 0–1)
Eosinophils Absolute: 0.2 10*3/uL (ref 0.0–0.7)
Eosinophils Absolute: 0.2 10*3/uL (ref 0.0–0.7)
Eosinophils Relative: 2 % (ref 0–5)
HCT: 38.1 % (ref 36.0–46.0)
Hemoglobin: 12.5 g/dL (ref 12.0–15.0)
Lymphocytes Relative: 28 % (ref 12–46)
MCH: 28.7 pg (ref 26.0–34.0)
MCH: 28.5 pg (ref 26.0–34.0)
MCHC: 32.8 g/dL (ref 30.0–36.0)
MCV: 86.3 fL (ref 78.0–100.0)
Monocytes Absolute: 0.5 10*3/uL (ref 0.1–1.0)
Monocytes Relative: 5 % (ref 3–12)
Platelets: 234 10*3/uL (ref 150–400)
RDW: 12.3 % (ref 11.5–15.5)
WBC: 8.4 10*3/uL (ref 4.0–10.5)

## 2012-10-01 LAB — BASIC METABOLIC PANEL
BUN: 13 mg/dL (ref 6–23)
Chloride: 101 mEq/L (ref 96–112)
Creatinine, Ser: 0.58 mg/dL (ref 0.50–1.10)
GFR calc Af Amer: >90 mL/min (ref >90)
GFR calc non Af Amer: >90 mL/min (ref >90)
Glucose, Bld: 93 mg/dL (ref 70–99)

## 2012-10-01 LAB — URINALYSIS, ROUTINE W REFLEX MICROSCOPIC
Bilirubin Urine: NEGATIVE
Ketones, ur: 15 mg/dL — AB
Nitrite: NEGATIVE
Protein, ur: NEGATIVE mg/dL
Urobilinogen, UA: 0.2 mg/dL (ref 0.0–1.0)
pH: 5.5 (ref 5.0–8.0)

## 2012-10-01 LAB — URINE MICROSCOPIC-ADD ON

## 2012-10-01 MED ORDER — KETOROLAC TROMETHAMINE 30 MG/ML IJ SOLN
30.0000 mg | Freq: Once | INTRAMUSCULAR | Status: AC
  Administered 2012-10-01: 30 mg via INTRAVENOUS
  Filled 2012-10-01: qty 1

## 2012-10-01 MED ORDER — ONDANSETRON HCL 4 MG/2ML IJ SOLN
4.0000 mg | Freq: Once | INTRAMUSCULAR | Status: AC
Start: 2012-10-01 — End: 2012-10-01
  Administered 2012-10-01: 4 mg via INTRAVENOUS
  Filled 2012-10-01: qty 2

## 2012-10-01 MED ORDER — MORPHINE SULFATE 4 MG/ML IJ SOLN
4.0000 mg | Freq: Once | INTRAMUSCULAR | Status: AC
  Administered 2012-10-01: 4 mg via INTRAVENOUS
  Filled 2012-10-01: qty 1

## 2012-10-01 MED ORDER — HYDROMORPHONE HCL PF 1 MG/ML IJ SOLN
1.0000 mg | Freq: Once | INTRAMUSCULAR | Status: AC
  Administered 2012-10-01: 1 mg via INTRAVENOUS
  Filled 2012-10-01: qty 1

## 2012-10-01 MED ORDER — SODIUM CHLORIDE 0.9 % IV BOLUS (SEPSIS)
1000.0000 mL | INTRAVENOUS | Status: AC
  Administered 2012-10-01: 1000 mL via INTRAVENOUS

## 2012-10-01 NOTE — ED Notes (Signed)
Pt c/o bilateral flank pain x 2 wks; treated at [facility] and sent to [facility] for further evaluation; blood in urine

## 2012-10-01 NOTE — ED Notes (Signed)
Dr Effie Shy made aware of pt arrival to ER

## 2012-10-01 NOTE — MAU Note (Signed)
Recently seen for UTI on 7/18 in MAU. Now having increased flank pain bilaterally. Denies urinary frequency, urgency, or burning with urination. States may have 1-2 days left of antibiotic. States having chills but no fevers.

## 2012-10-01 NOTE — MAU Note (Signed)
Patient states she was recently seen in MAU with a UTI. States she is now having bilateral flank pain that started yesterday. States she has a couple days left on antibiotic.

## 2012-10-01 NOTE — MAU Provider Note (Signed)
History     CSN: [id number]  Arrival date and time: 10/01/12 1543   None     Chief Complaint  Patient presents with  . Flank Pain   HPI Susan Burgess is 27 y.o. [id number]  presenting with bilateral flank pain that comes an goes after initially feeling better after begin treated here on 09/25/12.  Had chills at 2am and again at 1pm today.  Nausea on her way here.   Denies fever and vomiting.   She was seen here on 7/18 dx with acute pylonephritis.  She was treated in MAU with Rocephin 1mg , pyridium,zofran and percocet. She was given Rx for Cipro.  Urine culture was + for Proteus Mirabilis >100,000 and sensitive to both Rocephin and Cipro.  Her WBC 10.3 on that visit.     Past Medical History  Diagnosis Date  . No pertinent past medical history   . 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 Dysrhythmia   . Anxiety     Past Surgical History  Procedure Laterality Date  . Tonsillectomy      Family History  Problem Relation Age of Onset  . Diabetes Maternal Aunt   . Diabetes Maternal Grandfather   . Asthma Sister     History  Substance Use Topics  . Smoking status: Former Games developer  . Smokeless tobacco: Not on file  . Alcohol Use: Yes     Comment: occasional    Allergies: No Known Allergies  Prescriptions prior to admission  Medication Sig Dispense Refill  . ciprofloxacin (CIPRO) 500 MG tablet Take 1 tablet (500 mg total) by mouth 2 (two) times daily.  20 tablet  0  . clonazePAM (KLONOPIN) 0.5 MG tablet Take 0.5 mg by mouth 2 (two) times daily as needed for anxiety.      . norgestimate-ethinyl estradiol (ORTHO-CYCLEN,SPRINTEC,PREVIFEM) 0.25-35 MG-MCG tablet Take 1 tablet by mouth daily.  1 Package  11  . ondansetron (ZOFRAN-ODT) 4 MG disintegrating tablet Take 1 tablet (4 mg total) by mouth every 4 (four) hours as needed for nausea.  20 tablet  0  . oxyCODONE-acetaminophen (PERCOCET/ROXICET) 5-325 MG per tablet Take 1-2 tablets by mouth every 4 (four) hours as needed for  pain.  20 tablet  0  . phenazopyridine (PYRIDIUM) 200 MG tablet Take 1 tablet (200 mg total) by mouth 3 (three) times daily as needed for pain.  10 tablet  0  . [DISCONTINUED] valACYclovir (VALTREX) 500 MG tablet Take 1 tablet (500 mg total) by mouth 2 (two) times daily.  30 tablet  3    Review of Systems  Constitutional: Positive for chills. Negative for fever.  Gastrointestinal: Positive for nausea. Negative for vomiting and abdominal pain.  Genitourinary: Negative for dysuria, urgency, frequency and hematuria.  Musculoskeletal:       Bilateral flank pain   Physical Exam   Blood pressure 123/80, pulse 81, temperature 97.9 F (36.6 C), temperature source Oral, resp. rate 18, height 5' (1.524 m), weight 182 lb 9.6 oz (82.827 kg), last menstrual period 09/10/2012, SpO2 100.00%.  Physical Exam  Constitutional: She is oriented to person, place, and time. She appears well-developed and well-nourished. No distress.  HENT:  Head: Normocephalic.  Neck: Normal range of motion.  Cardiovascular: Normal rate.   Respiratory: Effort normal.  GI: Soft. She exhibits no distension and no mass. There is no tenderness. There is CVA tenderness (bilateral). There is no rebound and no guarding.  Genitourinary:  Not indicated  Neurological: She is  alert and oriented to person, place, and time.  Skin: Skin is warm and dry.  Psychiatric: She has a normal mood and affect. Her behavior is normal.    MAU Course  Procedures  MDM Discussed HPI, lab results and reviewed last visit with Dr. Jolayne Panther.  Order given to transfer to [facility] for evaluation to r/o kidney stones. Transferred care to [facility]--Dr. Effie Shy accepted transfer. Reported that patient is stable, comfortable and that no medication has been given.  Assessment and Plan  A:  Bilateral Flank Pain  P:  Transfer to [facility] for further evaluation     Patient is not acutely ill and is able to go by private vehicle.  Stephan Nelis,EVE M 10/01/2012, 5:36 PM

## 2012-10-01 NOTE — ED Provider Notes (Signed)
CSN: [id number]     Arrival date & time 10/01/12  1803 History     First MD Initiated Contact with Patient 10/01/12 2131     Chief Complaint  Patient presents with  . Flank Pain   (Consider location/radiation/quality/duration/timing/severity/associated sxs/prior Treatment) HPI Comments: Patient is a 27 year old female with no significant past medical history who presents with a 2 week history of bilateral flank pain. The pain is located in her bilateral flanks and does not radiate. The pain is described as aching and severe. The pain started gradually and progressively worsened since the onset. No alleviating/aggravating factors. The patient has tried nothing for symptoms without relief. Associated symptoms include hematuria. Patient denies fever, headache, NVD, chest pain, SOB, dysuria, constipation, abnormal vaginal bleeding/discharge. Patient was sent from [facility] for further evaluation.      Past Medical History  Diagnosis Date  . No pertinent past medical history   . 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 Dysrhythmia   . Anxiety    Past Surgical History  Procedure Laterality Date  . Tonsillectomy     Family History  Problem Relation Age of Onset  . Diabetes Maternal Aunt   . Diabetes Maternal Grandfather   . Asthma Sister    History  Substance Use Topics  . Smoking status: Former Games developer  . Smokeless tobacco: Not on file  . Alcohol Use: Yes     Comment: occasional   OB History   Grav Para Term Preterm Abortions TAB SAB Ect Mult Living   3 3 3       3      Review of Systems  Genitourinary: Positive for hematuria and flank pain.  All other systems reviewed and are negative.    Allergies  Review of patient's allergies indicates no known allergies.  Home Medications   Current Outpatient Rx  Name  Route  Sig  Dispense  Refill  . ciprofloxacin (CIPRO) 500 MG tablet   Oral   Take 1 tablet (500 mg total) by mouth 2 (two) times daily.   20 tablet  0   . clonazePAM (KLONOPIN) 0.5 MG tablet   Oral   Take 0.5 mg by mouth 2 (two) times daily as needed for anxiety.         . norgestimate-ethinyl estradiol (ORTHO-CYCLEN,SPRINTEC,PREVIFEM) 0.25-35 MG-MCG tablet   Oral   Take 1 tablet by mouth daily.   1 Package   11   . ondansetron (ZOFRAN-ODT) 4 MG disintegrating tablet   Oral   Take 1 tablet (4 mg total) by mouth every 4 (four) hours as needed for nausea.   20 tablet   0   . oxyCODONE-acetaminophen (PERCOCET/ROXICET) 5-325 MG per tablet   Oral   Take 1-2 tablets by mouth every 4 (four) hours as needed for pain.   20 tablet   0   . phenazopyridine (PYRIDIUM) 200 MG tablet   Oral   Take 1 tablet (200 mg total) by mouth 3 (three) times daily as needed for pain.   10 tablet   0    BP 124/78  Pulse 73  Temp(Src) 97.8 F (36.6 C) (Oral)  Resp 20  Wt 183 lb (83.008 kg)  BMI 35.74 kg/m2  SpO2 98%  LMP 09/10/2012 Physical Exam  Nursing note and vitals reviewed. Constitutional: She is oriented to person, place, and time. She appears well-developed and well-nourished. No distress.  HENT:  Head: Normocephalic and atraumatic.  Eyes: Conjunctivae are normal.  Neck:  Normal range of motion.  Cardiovascular: Normal rate and regular rhythm.  Exam reveals no gallop and no friction rub.   No murmur heard. Pulmonary/Chest: Effort normal and breath sounds normal. She has no wheezes. She has no rales. She exhibits no tenderness.  Abdominal: Soft. She exhibits no distension. There is no tenderness. There is no rebound and no guarding.  Genitourinary:  Bilateral CVA tenderness.   Musculoskeletal: Normal range of motion.  Neurological: She is alert and oriented to person, place, and time. Coordination normal.  Speech is goal-oriented. Moves limbs without ataxia.   Skin: Skin is warm and dry.  Psychiatric: She has a normal mood and affect. Her behavior is normal.    ED Course   Procedures (including critical care time)  Labs  Reviewed  URINALYSIS, ROUTINE W REFLEX MICROSCOPIC - Abnormal; Notable for the following:    APPearance CLOUDY (*)    Specific Gravity, Urine 1.034 (*)    Hgb urine dipstick MODERATE (*)    Ketones, ur 15 (*)    Leukocytes, UA SMALL (*)    All other components within normal limits  URINE MICROSCOPIC-ADD ON - Abnormal; Notable for the following:    Squamous Epithelial / LPF MANY (*)    All other components within normal limits  URINE CULTURE  CBC WITH DIFFERENTIAL  BASIC METABOLIC PANEL  PREGNANCY, URINE   Ct Abdomen Pelvis Wo Contrast  10/02/2012   *RADIOLOGY REPORT*  Clinical Data: Bilateral flank pain for 2 weeks.  Blood in the urine.  CT ABDOMEN AND PELVIS WITHOUT CONTRAST  Technique:  Multidetector CT imaging of the abdomen and pelvis was performed following the standard protocol without intravenous contrast.  Comparison: None.  Findings: The lung bases are clear.  Small esophageal hiatal hernia.  The kidneys appear symmetrical in size and shape.  No pyelocaliectasis or ureterectasis.  No renal, ureteral, or bladder stones are identified.  The bladder is decompressed.  The unenhanced appearance of the liver, spleen, gallbladder, pancreas, adrenal glands, abdominal aorta, inferior vena cava, retroperitoneal lymph nodes, stomach, small bowel, and colon are unremarkable.  Small accessory spleens are present.  No free air or free fluid in the abdomen.  Small umbilical hernia containing fat.  Pelvis:  The uterus and ovaries are not enlarged.  No significant pelvic lymphadenopathy.  No free or loculated pelvic fluid collections.  The appendix is normal.  Stool filled rectosigmoid colon without evidence of diverticulitis.  Normal alignment of the lumbar spine.  IMPRESSION: No renal or ureteral stone or obstruction demonstrated.   Original Report Authenticated By: Burman Nieves, M.D.   1. Pyelonephritis     MDM  11:25 PM Urinalysis unremarkable. Labs unremarkable. Patient will have morphine for  pain. Patient will have CT scan abdomen pelvis to rule out kidney stone.   12:41 AM CT scan shows no uretal stone. Patient likely have pyelonephritis. Patient will have IV rocephin and more pain and nausea medications. After patient receives the IV antibiotics, she will be discharged with Bactrim and Tramadol and zofran. Vitals stable and patient afebrile.   Emilia Beck, PA-C 10/02/12 0045

## 2012-10-01 NOTE — ED Notes (Signed)
Patient transported to CT 

## 2012-10-02 MED ORDER — TRAMADOL HCL 50 MG PO TABS: 50.0000 mg | ORAL_TABLET | Freq: Four times a day (QID) | ORAL | Status: DC | PRN

## 2012-10-02 MED ORDER — HYDROMORPHONE HCL PF 1 MG/ML IJ SOLN
1.0000 mg | Freq: Once | INTRAMUSCULAR | Status: AC
  Administered 2012-10-02: 1 mg via INTRAVENOUS
  Filled 2012-10-02: qty 1

## 2012-10-02 MED ORDER — DEXTROSE 5 % IV SOLN
1.0000 g | Freq: Once | INTRAVENOUS | Status: DC
  Filled 2012-10-02: qty 10

## 2012-10-02 MED ORDER — ONDANSETRON HCL 4 MG/2ML IJ SOLN
4.0000 mg | Freq: Once | INTRAMUSCULAR | Status: AC
  Administered 2012-10-02: 4 mg via INTRAVENOUS
  Filled 2012-10-02: qty 2

## 2012-10-02 MED ORDER — ONDANSETRON 4 MG PO TBDP: 4.0000 mg | ORAL_TABLET | Freq: Three times a day (TID) | ORAL | Status: DC | PRN

## 2012-10-02 MED ORDER — SULFAMETHOXAZOLE-TRIMETHOPRIM 800-160 MG PO TABS: 1.0000 | ORAL_TABLET | Freq: Two times a day (BID) | ORAL | Status: AC

## 2012-10-02 NOTE — MAU Provider Note (Signed)
Attestation of Attending Supervision of Advanced Practitioner (PA/CNM/NP): Evaluation and management procedures were performed by the Advanced Practitioner under my supervision and collaboration.  I have reviewed the Advanced Practitioner's note and chart, and I agree with the management and plan.  Persais Ethridge, MD, FACOG Attending Obstetrician & Gynecologist Faculty Practice, [facility]  

## 2012-10-03 LAB — URINE CULTURE
Colony Count: 50000
Special Requests: NORMAL

## 2012-10-04 ENCOUNTER — Encounter (HOSPITAL_COMMUNITY): Payer: Self-pay | Admitting: Emergency Medicine

## 2012-10-04 ENCOUNTER — Emergency Department (HOSPITAL_COMMUNITY): Payer: Medicaid Other

## 2012-10-04 ENCOUNTER — Emergency Department (HOSPITAL_COMMUNITY)
Admission: EM | Admit: 2012-10-04 | Discharge: 2012-10-04 | Disposition: A | Discharge status: Home / Self Care | Payer: Medicaid Other | Attending: Emergency Medicine | Admitting: Emergency Medicine

## 2012-10-04 DIAGNOSIS — Z87891 Personal history of nicotine dependence: Secondary | ICD-10-CM | POA: Insufficient documentation

## 2012-10-04 DIAGNOSIS — Z3202 Encounter for pregnancy test, result negative: Secondary | ICD-10-CM | POA: Insufficient documentation

## 2012-10-04 DIAGNOSIS — Z79899 Other long term (current) drug therapy: Secondary | ICD-10-CM | POA: Insufficient documentation

## 2012-10-04 DIAGNOSIS — R109 Unspecified abdominal pain: Secondary | ICD-10-CM | POA: Insufficient documentation

## 2012-10-04 DIAGNOSIS — Z8619 Personal history of other infectious and parasitic diseases: Secondary | ICD-10-CM | POA: Insufficient documentation

## 2012-10-04 DIAGNOSIS — Z8679 Personal history of other diseases of the circulatory system: Secondary | ICD-10-CM | POA: Insufficient documentation

## 2012-10-04 DIAGNOSIS — R112 Nausea with vomiting, unspecified: Secondary | ICD-10-CM | POA: Insufficient documentation

## 2012-10-04 DIAGNOSIS — F411 Generalized anxiety disorder: Secondary | ICD-10-CM | POA: Insufficient documentation

## 2012-10-04 DIAGNOSIS — R52 Pain, unspecified: Secondary | ICD-10-CM | POA: Insufficient documentation

## 2012-10-04 LAB — URINALYSIS, ROUTINE W REFLEX MICROSCOPIC
Bilirubin Urine: NEGATIVE
Glucose, UA: NEGATIVE mg/dL
Ketones, ur: NEGATIVE mg/dL
Nitrite: NEGATIVE
Protein, ur: NEGATIVE mg/dL

## 2012-10-04 LAB — CBC WITH DIFFERENTIAL/PLATELET
Basophils Absolute: 0 10*3/uL (ref 0.0–0.1)
Basophils Relative: 0 % (ref 0–1)
Eosinophils Absolute: 0.2 10*3/uL (ref 0.0–0.7)
HCT: 36.9 % (ref 36.0–46.0)
MCH: 29.1 pg (ref 26.0–34.0)
MCHC: 33.3 g/dL (ref 30.0–36.0)
Monocytes Absolute: 0.6 10*3/uL (ref 0.1–1.0)
Neutro Abs: 3.4 10*3/uL (ref 1.7–7.7)
RDW: 12.3 % (ref 11.5–15.5)

## 2012-10-04 LAB — COMPREHENSIVE METABOLIC PANEL
AST: 24 U/L (ref 0–37)
Albumin: 3.2 g/dL — ABNORMAL LOW (ref 3.5–5.2)
BUN: 10 mg/dL (ref 6–23)
Calcium: 8.7 mg/dL (ref 8.4–10.5)
Chloride: 101 mEq/L (ref 96–112)
Creatinine, Ser: 0.74 mg/dL (ref 0.50–1.10)
Total Bilirubin: 0.2 mg/dL — ABNORMAL LOW (ref 0.3–1.2)
Total Protein: 6.7 g/dL (ref 6.0–8.3)

## 2012-10-04 LAB — PREGNANCY, URINE: Preg Test, Ur: NEGATIVE

## 2012-10-04 LAB — LIPASE, BLOOD: Lipase: 17 U/L (ref 11–59)

## 2012-10-04 LAB — URINE MICROSCOPIC-ADD ON

## 2012-10-04 MED ORDER — SODIUM CHLORIDE 0.9 % IV SOLN
Freq: Once | INTRAVENOUS | Status: AC
  Administered 2012-10-04: 16:00:00 via INTRAVENOUS

## 2012-10-04 MED ORDER — IOHEXOL 300 MG/ML  SOLN
25.0000 mL | INTRAMUSCULAR | Status: AC
  Administered 2012-10-04: 25 mL via ORAL

## 2012-10-04 MED ORDER — HYDROCODONE-ACETAMINOPHEN 5-325 MG PO TABS: ORAL_TABLET | ORAL | Status: DC

## 2012-10-04 MED ORDER — IOHEXOL 300 MG/ML  SOLN
100.0000 mL | Freq: Once | INTRAMUSCULAR | Status: AC | PRN
  Administered 2012-10-04: 100 mL via INTRAVENOUS

## 2012-10-04 MED ORDER — CYCLOBENZAPRINE HCL 10 MG PO TABS: 10.0000 mg | ORAL_TABLET | Freq: Two times a day (BID) | ORAL | Status: DC | PRN

## 2012-10-04 MED ORDER — MORPHINE SULFATE 4 MG/ML IJ SOLN
4.0000 mg | Freq: Once | INTRAMUSCULAR | Status: AC
  Administered 2012-10-04: 4 mg via INTRAVENOUS
  Filled 2012-10-04: qty 1

## 2012-10-04 MED ORDER — ONDANSETRON HCL 4 MG/2ML IJ SOLN
4.0000 mg | Freq: Once | INTRAMUSCULAR | Status: AC
  Administered 2012-10-04: 4 mg via INTRAVENOUS
  Filled 2012-10-04: qty 2

## 2012-10-04 NOTE — ED Notes (Signed)
Pt. Stated, i went to [facility] 3-4 days ago for kidney infection, given antibiotic and pain medication and the pain is still there.

## 2012-10-04 NOTE — ED Notes (Signed)
Pt finished oral contrast. CT made aware.

## 2012-10-04 NOTE — ED Provider Notes (Signed)
This is a signout from PA up still at shift change: Susan Burgess is a 27 y.o. female complaining of bilateral flank pain worsening over the course of 3 weeks left greater than right radiating into abdomen associated with nausea and vomiting for one day. Patient was treated for a pyelonephritis. Culture grew Proteus and secondary culture is negative. Patient is afebrile, nonseptic appearance with no change in her bowel habits. Patient had stone protocol CT which was negative. Patient is pending CT with contrast.  CT results show no acute abnormalities. Discussed case with attending who agrees with plan and stability to d/c to home.   Pt is hemodynamically stable, appropriate for, and amenable to discharge at this time. Pt verbalized understanding and agrees with care plan. Outpatient follow-up and specific return precautions discussed.    Discharge Medication List as of 10/04/2012  7:51 PM    START taking these medications   Details  cyclobenzaprine (FLEXERIL) 10 MG tablet Take 1 tablet (10 mg total) by mouth 2 (two) times daily as needed for muscle spasms., Starting 10/04/2012, Until Discontinued, Print    HYDROcodone-acetaminophen (NORCO/VICODIN) 5-325 MG per tablet Take 1-2 tablets by mouth every 6 hours as needed for pain., [employer], PA-C 10/04/12 2021

## 2012-10-04 NOTE — ED Notes (Signed)
Contacted lab about urine preg. CT made aware of delay.

## 2012-10-04 NOTE — ED Provider Notes (Signed)
Medical screening examination/treatment/procedure(s) were performed by non-physician practitioner and as supervising physician I was immediately available for consultation/collaboration.   Charles B. Bernette Mayers, MD 10/04/12 [phone]

## 2012-10-04 NOTE — ED Provider Notes (Signed)
CSN: [id number]     Arrival date & time 10/04/12  1356 History     First MD Initiated Contact with Patient 10/04/12 1439     Chief Complaint  Patient presents with  . Flank Pain   (Consider location/radiation/quality/duration/timing/severity/associated sxs/prior Treatment) Patient is a 27 y.o. female presenting with flank pain. The history is provided by the patient. No language interpreter was used.  Flank Pain The current episode started 1 to 4 weeks ago. The problem occurs constantly. The problem has been gradually worsening. Associated symptoms include abdominal pain, nausea and vomiting. Pertinent negatives include no chest pain, chills, coughing, fever, myalgias or rash. Associated symptoms comments: She returns to ED for worsening/progressive bilateral flank pain that started 3 weeks ago. During the time she has had flank pain, she developed urinary symptoms of dysuria and frequency that were diagnosed and treated as UTI with antibiotics and that have resolved without change to the flank pain. The pain continued to worsen without fever, further dysuria or hematuria and she again presented to the ED on 10/01/12. She had negative lab studies and a noncontrast CT that ruled out kidney stones. A review of the lab studies show that previous urine culture showed proteus and a repeat culture subsequent to this showed clearing of any infection. Today, she states that pain on the left is greater and pain has begun to radiate, causing pain in LUQ and LLQ abdomen. She started having N with V yesterday. Still no fever. .    Past Medical History  Diagnosis Date  . No pertinent past medical history   . 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 Dysrhythmia   . Anxiety    Past Surgical History  Procedure Laterality Date  . Tonsillectomy     Family History  Problem Relation Age of Onset  . Diabetes Maternal Aunt   . Diabetes Maternal Grandfather   . Asthma Sister    History  Substance Use  Topics  . Smoking status: Former Games developer  . Smokeless tobacco: Not on file  . Alcohol Use: Yes     Comment: occasional   OB History   Grav Para Term Preterm Abortions TAB SAB Ect Mult Living   3 3 3       3      Review of Systems  Constitutional: Negative for fever and chills.  HENT: Negative.   Respiratory: Negative.  Negative for cough and shortness of breath.   Cardiovascular: Negative.  Negative for chest pain.  Gastrointestinal: Positive for nausea, vomiting and abdominal pain. Negative for diarrhea.  Genitourinary: Positive for flank pain. Negative for dysuria, frequency, hematuria, vaginal bleeding and vaginal discharge.  Musculoskeletal: Negative.  Negative for myalgias.  Skin: Negative.  Negative for rash.  Neurological: Negative.     Allergies  Review of patient's allergies indicates no known allergies.  Home Medications   Current Outpatient Rx  Name  Route  Sig  Dispense  Refill  . clonazePAM (KLONOPIN) 0.5 MG tablet   Oral   Take 0.5 mg by mouth 2 (two) times daily as needed for anxiety.         . norgestimate-ethinyl estradiol (ORTHO-CYCLEN,SPRINTEC,PREVIFEM) 0.25-35 MG-MCG tablet   Oral   Take 1 tablet by mouth daily.   1 Package   11   . sulfamethoxazole-trimethoprim (SEPTRA DS) 800-160 MG per tablet   Oral   Take 1 tablet by mouth every 12 (twelve) hours.   20 tablet   0   . traMADol (  ULTRAM) 50 MG tablet   Oral   Take 1 tablet (50 mg total) by mouth every 6 (six) hours as needed for pain.   15 tablet   0    BP 127/80  Pulse 67  Temp(Src) 98.5 F (36.9 C) (Oral)  Resp 18  SpO2 99%  LMP 09/10/2012 Physical Exam  Constitutional: She is oriented to person, place, and time. She appears well-developed and well-nourished.  HENT:  Head: Normocephalic.  Neck: Normal range of motion. Neck supple.  Cardiovascular: Normal rate and regular rhythm.   Pulmonary/Chest: Effort normal and breath sounds normal.  Abdominal: Soft. Bowel sounds are  normal. There is tenderness. There is no rebound and no guarding.  LUQ and LLQ tenderness to obese abdomen. No guarding or rebound. BS hypoactive but present.   Genitourinary:  Bilateral flank tenderness to palpation. No swelling, rash, discoloration.   Musculoskeletal: Normal range of motion.  There is also midline spinal and paraspinal tenderness.  Neurological: She is alert and oriented to person, place, and time.  Skin: Skin is warm and dry. No rash noted.  Psychiatric: She has a normal mood and affect.    ED Course   Procedures (including critical care time)  Labs Reviewed  URINALYSIS, ROUTINE W REFLEX MICROSCOPIC - Abnormal; Notable for the following:    APPearance CLOUDY (*)    Hgb urine dipstick SMALL (*)    Leukocytes, UA SMALL (*)    All other components within normal limits  URINE MICROSCOPIC-ADD ON - Abnormal; Notable for the following:    Squamous Epithelial / LPF MANY (*)    All other components within normal limits  CBC WITH DIFFERENTIAL  COMPREHENSIVE METABOLIC PANEL  LIPASE, BLOOD  PREGNANCY, URINE   No results found. No diagnosis found.  MDM  Discussed with Dr. Bernette Mayers. It is unclear whether source of pain is flank or associated with the kidneys. Previous CT scan negative as non-contrast study. Pain now radiates causing abdominal pain and tenderness, also now with N, V. Will repeat CT scan with CM for full evaluation.   Arnoldo Hooker, PA-C 10/04/12 1654

## 2012-10-04 NOTE — ED Notes (Signed)
Pt reports bilateral flank pain x 3 weeks. PT seen at [facility] appx 3 days ago, dx home with abx and pain rx. Pt reports initial relief in pain, but returned today with N/V. Denies dysuria.

## 2012-10-05 NOTE — ED Provider Notes (Signed)
Medical screening examination/treatment/procedure(s) were performed by non-physician practitioner and as supervising physician I was immediately available for consultation/collaboration.   Charles B. Bernette Mayers, MD 10/05/12 1413

## 2012-10-06 NOTE — ED Provider Notes (Signed)
Medical screening examination/treatment/procedure(s) were performed by non-physician practitioner and as supervising physician I was immediately available for consultation/collaboration.  Nethra Mehlberg L Humbert Morozov, MD 10/06/12 1524 

## 2012-10-08 ENCOUNTER — Other Ambulatory Visit: Payer: Self-pay | Admitting: Obstetrics & Gynecology

## 2012-11-04 ENCOUNTER — Inpatient Hospital Stay (HOSPITAL_COMMUNITY)
Admission: AD | Admit: 2012-11-04 | Discharge: 2012-11-05 | Disposition: A | Discharge status: Home / Self Care | Payer: Medicaid Other | Source: Ambulatory Visit | Attending: Obstetrics and Gynecology | Admitting: Obstetrics and Gynecology

## 2012-11-04 DIAGNOSIS — R21 Rash and other nonspecific skin eruption: Secondary | ICD-10-CM | POA: Insufficient documentation

## 2012-11-04 DIAGNOSIS — L293 Anogenital pruritus, unspecified: Secondary | ICD-10-CM | POA: Insufficient documentation

## 2012-11-05 NOTE — MAU Note (Signed)
Pt reports rash and itching in upper inner thighs and vaginal area. Symptoms present x 24 hours

## 2012-11-05 NOTE — MAU Note (Signed)
Delay explained to pt, pt is sitting in her sister's room who is also being seen in MAU

## 2013-03-09 ENCOUNTER — Emergency Department (HOSPITAL_COMMUNITY)
Admission: EM | Admit: 2013-03-09 | Discharge: 2013-03-09 | Discharge status: Against Medical Advice | Payer: Medicaid Other | Attending: Emergency Medicine | Admitting: Emergency Medicine

## 2013-03-09 ENCOUNTER — Encounter (HOSPITAL_COMMUNITY): Payer: Self-pay | Admitting: Emergency Medicine

## 2013-03-09 DIAGNOSIS — R21 Rash and other nonspecific skin eruption: Secondary | ICD-10-CM | POA: Insufficient documentation

## 2013-03-09 DIAGNOSIS — Z8619 Personal history of other infectious and parasitic diseases: Secondary | ICD-10-CM | POA: Insufficient documentation

## 2013-03-09 NOTE — ED Notes (Signed)
Pt. reports generalized itchy rashes onset yesterday unrelieved by OTC Benadryl . Respirations unlabored/ airway intact

## 2014-01-10 ENCOUNTER — Encounter (HOSPITAL_COMMUNITY): Payer: Self-pay | Admitting: Emergency Medicine

## 2014-06-09 ENCOUNTER — Emergency Department (HOSPITAL_COMMUNITY)
Admission: EM | Admit: 2014-06-09 | Discharge: 2014-06-09 | Disposition: A | Discharge status: Home / Self Care | Payer: Medicaid Other | Attending: Emergency Medicine | Admitting: Emergency Medicine

## 2014-06-09 ENCOUNTER — Emergency Department (HOSPITAL_COMMUNITY): Payer: Medicaid Other

## 2014-06-09 ENCOUNTER — Encounter (HOSPITAL_COMMUNITY): Payer: Self-pay | Admitting: *Deleted

## 2014-06-09 DIAGNOSIS — Y9389 Activity, other specified: Secondary | ICD-10-CM | POA: Diagnosis not present

## 2014-06-09 DIAGNOSIS — Z79899 Other long term (current) drug therapy: Secondary | ICD-10-CM | POA: Insufficient documentation

## 2014-06-09 DIAGNOSIS — Z8619 Personal history of other infectious and parasitic diseases: Secondary | ICD-10-CM | POA: Diagnosis not present

## 2014-06-09 DIAGNOSIS — S3992XA Unspecified injury of lower back, initial encounter: Secondary | ICD-10-CM | POA: Insufficient documentation

## 2014-06-09 DIAGNOSIS — Y92410 Unspecified street and highway as the place of occurrence of the external cause: Secondary | ICD-10-CM | POA: Diagnosis not present

## 2014-06-09 DIAGNOSIS — Z87891 Personal history of nicotine dependence: Secondary | ICD-10-CM | POA: Insufficient documentation

## 2014-06-09 DIAGNOSIS — Y998 Other external cause status: Secondary | ICD-10-CM | POA: Diagnosis not present

## 2014-06-09 DIAGNOSIS — F419 Anxiety disorder, unspecified: Secondary | ICD-10-CM | POA: Insufficient documentation

## 2014-06-09 DIAGNOSIS — S161XXA Strain of muscle, fascia and tendon at neck level, initial encounter: Secondary | ICD-10-CM | POA: Diagnosis not present

## 2014-06-09 DIAGNOSIS — S199XXA Unspecified injury of neck, initial encounter: Secondary | ICD-10-CM | POA: Diagnosis present

## 2014-06-09 MED ORDER — HYDROCODONE-ACETAMINOPHEN 5-325 MG PO TABS: 1.0000 | ORAL_TABLET | Freq: Four times a day (QID) | ORAL | Status: DC | PRN

## 2014-06-09 NOTE — Discharge Instructions (Signed)
Cervical Strain and Sprain (Whiplash) °with Rehab °Cervical strain and sprain are injuries that commonly occur with "whiplash" injuries. Whiplash occurs when the neck is forcefully whipped backward or forward, such as during a motor vehicle accident or during contact sports. The muscles, ligaments, tendons, discs, and nerves of the neck are susceptible to injury when this occurs. °RISK FACTORS °Risk of having a whiplash injury increases if: °· Osteoarthritis of the spine. °· Situations that make head or neck accidents or trauma more likely. °· High-risk sports (football, rugby, wrestling, hockey, auto racing, gymnastics, diving, contact karate, or boxing). °· Poor strength and flexibility of the neck. °· Previous neck injury. °· Poor tackling technique. °· Improperly fitted or padded equipment. °SYMPTOMS  °· Pain or stiffness in the front or back of neck or both. °· Symptoms may present immediately or up to 24 hours after injury. °· Dizziness, headache, nausea, and vomiting. °· Muscle spasm with soreness and stiffness in the neck. °· Tenderness and swelling at the injury site. °PREVENTION °· Learn and use proper technique (avoid tackling with the head, spearing, and head-butting; use proper falling techniques to avoid landing on the head). °· Warm up and stretch properly before activity. °· Maintain physical fitness: °· Strength, flexibility, and endurance. °· Cardiovascular fitness. °· Wear properly fitted and padded protective equipment, such as padded soft collars, for participation in contact sports. °PROGNOSIS  °Recovery from cervical strain and sprain injuries is dependent on the extent of the injury. These injuries are usually curable in 1 week to 3 months with appropriate treatment.  °RELATED COMPLICATIONS  °· Temporary numbness and weakness may occur if the nerve roots are damaged, and this may persist until the nerve has completely healed. °· Chronic pain due to frequent recurrence of  symptoms. °· Prolonged healing, especially if activity is resumed too soon (before complete recovery). °TREATMENT  °Treatment initially involves the use of ice and medication to help reduce pain and inflammation. It is also important to perform strengthening and stretching exercises and modify activities that worsen symptoms so the injury does not get worse. These exercises may be performed at home or with a therapist. For patients who experience severe symptoms, a soft, padded collar may be recommended to be worn around the neck.  °Improving your posture may help reduce symptoms. Posture improvement includes pulling your chin and abdomen in while sitting or standing. If you are sitting, sit in a firm chair with your buttocks against the back of the chair. While sleeping, try replacing your pillow with a small towel rolled to 2 inches in diameter, or use a cervical pillow or soft cervical collar. Poor sleeping positions delay healing.  °For patients with nerve root damage, which causes numbness or weakness, the use of a cervical traction apparatus may be recommended. Surgery is rarely necessary for these injuries. However, cervical strain and sprains that are present at birth (congenital) may require surgery. °MEDICATION  °· If pain medication is necessary, nonsteroidal anti-inflammatory medications, such as aspirin and ibuprofen, or other minor pain relievers, such as acetaminophen, are often recommended. °· Do not take pain medication for 7 days before surgery. °· Prescription pain relievers may be given if deemed necessary by your caregiver. Use only as directed and only as much as you need. °HEAT AND COLD:  °· Cold treatment (icing) relieves pain and reduces inflammation. Cold treatment should be applied for 10 to 15 minutes every 2 to 3 hours for inflammation and pain and immediately after any activity that aggravates   your symptoms. Use ice packs or an ice massage. °· Heat treatment may be used prior to  performing the stretching and strengthening activities prescribed by your caregiver, physical therapist, or athletic trainer. Use a heat pack or a warm soak. °SEEK MEDICAL CARE IF:  °· Symptoms get worse or do not improve in 2 weeks despite treatment. °· New, unexplained symptoms develop (drugs used in treatment may produce side effects). °EXERCISES °RANGE OF MOTION (ROM) AND STRETCHING EXERCISES - Cervical Strain and Sprain °These exercises may help you when beginning to rehabilitate your injury. In order to successfully resolve your symptoms, you must improve your posture. These exercises are designed to help reduce the forward-head and rounded-shoulder posture which contributes to this condition. Your symptoms may resolve with or without further involvement from your physician, physical therapist or athletic trainer. While completing these exercises, remember:  °· Restoring tissue flexibility helps normal motion to return to the joints. This allows healthier, less painful movement and activity. °· An effective stretch should be held for at least 20 seconds, although you may need to begin with shorter hold times for comfort. °· A stretch should never be painful. You should only feel a gentle lengthening or release in the stretched tissue. °STRETCH- Axial Extensors °· Lie on your back on the floor. You may bend your knees for comfort. Place a rolled-up hand towel or dish towel, about 2 inches in diameter, under the part of your head that makes contact with the floor. °· Gently tuck your chin, as if trying to make a "double chin," until you feel a gentle stretch at the base of your head. °· Hold __________ seconds. °Repeat __________ times. Complete this exercise __________ times per day.  °STRETCH - Axial Extension  °· Stand or sit on a firm surface. Assume a good posture: chest up, shoulders drawn back, abdominal muscles slightly tense, knees unlocked (if standing) and feet hip width apart. °· Slowly retract your  chin so your head slides back and your chin slightly lowers. Continue to look straight ahead. °· You should feel a gentle stretch in the back of your head. Be certain not to feel an aggressive stretch since this can cause headaches later. °· Hold for __________ seconds. °Repeat __________ times. Complete this exercise __________ times per day. °STRETCH - Cervical Side Bend  °· Stand or sit on a firm surface. Assume a good posture: chest up, shoulders drawn back, abdominal muscles slightly tense, knees unlocked (if standing) and feet hip width apart. °· Without letting your nose or shoulders move, slowly tip your right / left ear to your shoulder until your feel a gentle stretch in the muscles on the opposite side of your neck. °· Hold __________ seconds. °Repeat __________ times. Complete this exercise __________ times per day. °STRETCH - Cervical Rotators  °· Stand or sit on a firm surface. Assume a good posture: chest up, shoulders drawn back, abdominal muscles slightly tense, knees unlocked (if standing) and feet hip width apart. °· Keeping your eyes level with the ground, slowly turn your head until you feel a gentle stretch along the back and opposite side of your neck. °· Hold __________ seconds. °Repeat __________ times. Complete this exercise __________ times per day. °RANGE OF MOTION - Neck Circles  °· Stand or sit on a firm surface. Assume a good posture: chest up, shoulders drawn back, abdominal muscles slightly tense, knees unlocked (if standing) and feet hip width apart. °· Gently roll your head down and around from the   back of one shoulder to the back of the other. The motion should never be forced or painful. °· Repeat the motion 10-20 times, or until you feel the neck muscles relax and loosen. °Repeat __________ times. Complete the exercise __________ times per day. °STRENGTHENING EXERCISES - Cervical Strain and Sprain °These exercises may help you when beginning to rehabilitate your injury. They may  resolve your symptoms with or without further involvement from your physician, physical therapist, or athletic trainer. While completing these exercises, remember:  °· Muscles can gain both the endurance and the strength needed for everyday activities through controlled exercises. °· Complete these exercises as instructed by your physician, physical therapist, or athletic trainer. Progress the resistance and repetitions only as guided. °· You may experience muscle soreness or fatigue, but the pain or discomfort you are trying to eliminate should never worsen during these exercises. If this pain does worsen, stop and make certain you are following the directions exactly. If the pain is still present after adjustments, discontinue the exercise until you can discuss the trouble with your clinician. °STRENGTH - Cervical Flexors, Isometric °· Face a wall, standing about 6 inches away. Place a small pillow, a ball about 6-8 inches in diameter, or a folded towel between your forehead and the wall. °· Slightly tuck your chin and gently push your forehead into the soft object. Push only with mild to moderate intensity, building up tension gradually. Keep your jaw and forehead relaxed. °· Hold 10 to 20 seconds. Keep your breathing relaxed. °· Release the tension slowly. Relax your neck muscles completely before you start the next repetition. °Repeat __________ times. Complete this exercise __________ times per day. °STRENGTH- Cervical Lateral Flexors, Isometric  °· Stand about 6 inches away from a wall. Place a small pillow, a ball about 6-8 inches in diameter, or a folded towel between the side of your head and the wall. °· Slightly tuck your chin and gently tilt your head into the soft object. Push only with mild to moderate intensity, building up tension gradually. Keep your jaw and forehead relaxed. °· Hold 10 to 20 seconds. Keep your breathing relaxed. °· Release the tension slowly. Relax your neck muscles completely  before you start the next repetition. °Repeat __________ times. Complete this exercise __________ times per day. °STRENGTH - Cervical Extensors, Isometric  °· Stand about 6 inches away from a wall. Place a small pillow, a ball about 6-8 inches in diameter, or a folded towel between the back of your head and the wall. °· Slightly tuck your chin and gently tilt your head back into the soft object. Push only with mild to moderate intensity, building up tension gradually. Keep your jaw and forehead relaxed. °· Hold 10 to 20 seconds. Keep your breathing relaxed. °· Release the tension slowly. Relax your neck muscles completely before you start the next repetition. °Repeat __________ times. Complete this exercise __________ times per day. °POSTURE AND BODY MECHANICS CONSIDERATIONS - Cervical Strain and Sprain °Keeping correct posture when sitting, standing or completing your activities will reduce the stress put on different body tissues, allowing injured tissues a chance to heal and limiting painful experiences. The following are general guidelines for improved posture. Your physician or physical therapist will provide you with any instructions specific to your needs. While reading these guidelines, remember: °· The exercises prescribed by your provider will help you have the flexibility and strength to maintain correct postures. °· The correct posture provides the optimal environment for your joints to   work. All of your joints have less wear and tear when properly supported by a spine with good posture. This means you will experience a healthier, less painful body. °· Correct posture must be practiced with all of your activities, especially prolonged sitting and standing. Correct posture is as important when doing repetitive low-stress activities (typing) as it is when doing a single heavy-load activity (lifting). °PROLONGED STANDING WHILE SLIGHTLY LEANING FORWARD °When completing a task that requires you to lean  forward while standing in one place for a long time, place either foot up on a stationary 2- to 4-inch high object to help maintain the best posture. When both feet are on the ground, the low back tends to lose its slight inward curve. If this curve flattens (or becomes too large), then the back and your other joints will experience too much stress, fatigue more quickly, and can cause pain.  °RESTING POSITIONS °Consider which positions are most painful for you when choosing a resting position. If you have pain with flexion-based activities (sitting, bending, stooping, squatting), choose a position that allows you to rest in a less flexed posture. You would want to avoid curling into a fetal position on your side. If your pain worsens with extension-based activities (prolonged standing, working overhead), avoid resting in an extended position such as sleeping on your stomach. Most people will find more comfort when they rest with their spine in a more neutral position, neither too rounded nor too arched. Lying on a non-sagging bed on your side with a pillow between your knees, or on your back with a pillow under your knees will often provide some relief. Keep in mind, being in any one position for a prolonged period of time, no matter how correct your posture, can still lead to stiffness. °WALKING °Walk with an upright posture. Your ears, shoulders, and hips should all line up. °OFFICE WORK °When working at a desk, create an environment that supports good, upright posture. Without extra support, muscles fatigue and lead to excessive strain on joints and other tissues. °CHAIR: °· A chair should be able to slide under your desk when your back makes contact with the back of the chair. This allows you to work closely. °· The chair's height should allow your eyes to be level with the upper part of your monitor and your hands to be slightly lower than your elbows. °· Body position: °¨ Your feet should make contact with the  floor. If this is not possible, use a foot rest. °¨ Keep your ears over your shoulders. This will reduce stress on your neck and low back. °Document Released: 02/25/2005 Document Revised: 07/12/2013 Document Reviewed: 06/09/2008 °[employer]® Patient Information ©2015 [employer]. This information is not intended to replace advice given to you by your health care provider. Make sure you discuss any questions you have with your health care provider. °Motor Vehicle Collision °It is common to have multiple bruises and sore muscles after a motor vehicle collision (MVC). These tend to feel worse for the first 24 hours. You may have the most stiffness and soreness over the first several hours. You may also feel worse when you wake up the first morning after your collision. After this point, you will usually begin to improve with each day. The speed of improvement often depends on the severity of the collision, the number of injuries, and the location and nature of these injuries. °HOME CARE INSTRUCTIONS °· Put ice on the injured area. °¨ Put ice in a   plastic bag. °¨ Place a towel between your skin and the bag. °¨ Leave the ice on for 15-20 minutes, 3-4 times a day, or as directed by your health care provider. °· Drink enough fluids to keep your urine clear or pale yellow. Do not drink alcohol. °· Take a warm shower or bath once or twice a day. This will increase blood flow to sore muscles. °· You may return to activities as directed by your caregiver. Be careful when lifting, as this may aggravate neck or back pain. °· Only take over-the-counter or prescription medicines for pain, discomfort, or fever as directed by your caregiver. Do not use aspirin. This may increase bruising and bleeding. °SEEK IMMEDIATE MEDICAL CARE IF: °· You have numbness, tingling, or weakness in the arms or legs. °· You develop severe headaches not relieved with medicine. °· You have severe neck pain, especially tenderness in the middle of the back  of your neck. °· You have changes in bowel or bladder control. °· There is increasing pain in any area of the body. °· You have shortness of breath, light-headedness, dizziness, or fainting. °· You have chest pain. °· You feel sick to your stomach (nauseous), throw up (vomit), or sweat. °· You have increasing abdominal discomfort. °· There is blood in your urine, stool, or vomit. °· You have pain in your shoulder (shoulder strap areas). °· You feel your symptoms are getting worse. °MAKE SURE YOU: °· Understand these instructions. °· Will watch your condition. °· Will get help right away if you are not doing well or get worse. °Document Released: 02/25/2005 Document Revised: 07/12/2013 Document Reviewed: 07/25/2010 °[employer]® Patient Information ©2015 [employer]. This information is not intended to replace advice given to you by your health care provider. Make sure you discuss any questions you have with your health care provider. ° °
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
## 2014-06-09 NOTE — ED Notes (Signed)
MD at bedside.EDPA ROBERT PRESENT SPEAKING WITH PT
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
## 2014-11-25 ENCOUNTER — Encounter (HOSPITAL_COMMUNITY): Payer: Self-pay | Admitting: Emergency Medicine

## 2014-11-25 ENCOUNTER — Emergency Department (HOSPITAL_COMMUNITY)
Admission: EM | Admit: 2014-11-25 | Discharge: 2014-11-25 | Disposition: A | Discharge status: Home / Self Care | Payer: Medicaid Other | Attending: Emergency Medicine | Admitting: Emergency Medicine

## 2014-11-25 DIAGNOSIS — F419 Anxiety disorder, unspecified: Secondary | ICD-10-CM | POA: Insufficient documentation

## 2014-11-25 DIAGNOSIS — Z87891 Personal history of nicotine dependence: Secondary | ICD-10-CM | POA: Diagnosis not present

## 2014-11-25 DIAGNOSIS — Z8679 Personal history of other diseases of the circulatory system: Secondary | ICD-10-CM | POA: Insufficient documentation

## 2014-11-25 DIAGNOSIS — Z8619 Personal history of other infectious and parasitic diseases: Secondary | ICD-10-CM | POA: Insufficient documentation

## 2014-11-25 DIAGNOSIS — K0510 Chronic gingivitis, plaque induced: Secondary | ICD-10-CM | POA: Diagnosis not present

## 2014-11-25 DIAGNOSIS — Z79899 Other long term (current) drug therapy: Secondary | ICD-10-CM | POA: Insufficient documentation

## 2014-11-25 DIAGNOSIS — R21 Rash and other nonspecific skin eruption: Secondary | ICD-10-CM | POA: Diagnosis present

## 2014-11-25 MED ORDER — TRAMADOL HCL 50 MG PO TABS: 50.0000 mg | ORAL_TABLET | Freq: Four times a day (QID) | ORAL | Status: DC

## 2014-11-25 MED ORDER — VALACYCLOVIR HCL 1 G PO TABS: 1000.0000 mg | ORAL_TABLET | Freq: Three times a day (TID) | ORAL | Status: AC

## 2014-11-25 MED ORDER — TRAMADOL HCL 50 MG PO TABS: 50.0000 mg | ORAL_TABLET | Freq: Four times a day (QID) | ORAL | Status: AC

## 2014-11-25 NOTE — Discharge Instructions (Signed)
Stomatitis °Stomatitis is an inflammation of the mucous lining of the mouth. It can affect part of the mouth or the whole mouth. The intensity of symptoms can range from mild to severe. It can affect your cheek, teeth, gums, lips, or tongue. In almost all cases, the lining of the mouth becomes swollen, red, and painful. Painful ulcers can develop in your mouth. Stomatitis recurs in some people. °CAUSES  °There are many common causes of stomatitis. They include: °· Viruses (such as cold sores or shingles). °· Canker sores. °· Bacteria (such as ulcerative gingivitis or sexually transmitted diseases). °· Fungus or yeast (such as candidiasis or oral thrush). °· Poor oral hygiene and poor nutrition (Vincent's stomatitis or trench mouth). °· Lack of vitamin B, vitamin C, or niacin. °· Dentures or braces that do not fit properly. °· High acid foods (uncommon). °· Sharp or broken teeth. °· Cheek biting. °· Breathing through the mouth. °· Chewing tobacco. °· Allergy to toothpaste, mouthwash, candy, gum, lipstick, or some medicines. °· Burning your mouth with hot drinks or food. °· Exposure to dyes, heavy metals, acid fumes, or mineral dust. °SYMPTOMS  °· Painful ulcers in the mouth. °· Blisters in the mouth. °· Bleeding gums. °· Swollen gums. °· Irritability. °· Bad breath. °· Bad taste in the mouth. °· Fever. °· Trouble eating because of burning and pain in the mouth. °DIAGNOSIS  °Your caregiver will examine your mouth and look for bleeding gums and mouth ulcers. Your caregiver may ask you about the medicines you are taking. Your caregiver may suggest a blood test and tissue sample (biopsy) of the mouth ulcer or mass if either is present. This will help find the cause of your condition. °TREATMENT  °Your treatment will depend on the cause of your condition. Your caregiver will first try to treat your symptoms.  °· You may be given pain medicine. Topical anesthetic may be used to numb the area if you have severe  pain. °· Your caregiver may prescribe antibiotic medicine if you have a bacterial infection. °· Your caregiver may prescribe antifungal medicine if you have a fungal infection. °· You may need to take antiviral medicine if you have a viral infection like herpes. °· You may be asked to use medicated mouth rinses. °· Your caregiver will advise you about proper brushing and using a soft toothbrush. You also need to get your teeth cleaned regularly. °HOME CARE INSTRUCTIONS  °· Maintain good oral hygiene. This is especially important for transplant patients. °· Brush your teeth carefully with a soft, nylon-bristled toothbrush. °· Floss at least 2 times a day. °· Clean your mouth after eating. °· Rinse your mouth with salt water 3 to 4 times a day. °· Gargle with cold water. °· Use topical numbing medicines to decrease pain if recommended by your caregiver. °· Stop smoking, and stop using chewing or smokeless tobacco. °· Avoid eating hot and spicy foods. °· Eat soft and bland food. °· Reduce your stress wherever possible. °· Eat healthy and nutritious foods. °SEEK MEDICAL CARE IF:  °· Your symptoms persist or get worse. °· You develop new symptoms. °· Your mouth ulcers are present for more than 3 weeks. °· Your mouth ulcers come back frequently. °· You have increasing difficulty with normal eating and drinking. °· You have increasing fatigue or weakness. °· You develop loss of appetite or nausea. °SEEK IMMEDIATE MEDICAL CARE IF:  °· You have a fever. °· You develop pain, redness, or sores around one or both   eyes. °· You cannot eat or drink because of pain or other symptoms. °· You develop worsening weakness, or you faint. °· You develop vomiting or diarrhea. °· You develop chest pain, shortness of breath, or rapid and irregular heartbeats. °MAKE SURE YOU: °· Understand these instructions. °· Will watch your condition. °· Will get help right away if you are not doing well or get worse. °Document Released: 12/23/2006  Document Revised: 05/20/2011 Document Reviewed: 10/04/2010 °[employer]® Patient Information ©2015 [employer]. This information is not intended to replace advice given to you by your health care provider. Make sure you discuss any questions you have with your health care provider. ° °

## 2014-11-25 NOTE — ED Notes (Addendum)
Patient c/o lip swelling, states her lips have been swollen for 1 week. Patient states her lips were crusted over this am, patient states she removed the crust and they are hurting. Patient states she noticed her lips to be itchy on Saturday and have worsened throughout the week. Patient denies any new foods/ beauty products, etc. Patient applied medicated blistex this am. Patient states after removing the "crust" her lips began oozing. Patient has been taking benadryl throughout the week, last dose 1 pill at 0700. Patient states this does not look like her Herpes outbreaks in the past, when her symptoms started she began taking Valtrex on Saturday.

## 2014-11-25 NOTE — ED Provider Notes (Signed)
CSN: [id number]     Arrival date & time 11/25/14  0848 History   First MD Initiated Contact with Patient 11/25/14 [phone]     Chief Complaint  Patient presents with  . Rash   HPI Patient presents to emergency room with complaints of soreness in the lips for the past week. Patient thought that it may be a herpes outbreak initially. She took a few doses of Valtrex that she had left. The symptoms did not improve. She had some itching and burning discomfort as well she took some Benadryl. Running when she woke up she felt that her lips were crusted and oozing. They are swollen and uncomfortable. She denies any trouble with her swallowing. She is not having any difficulty breathing. No fevers or chills. Past Medical History  Diagnosis Date  . No pertinent past medical history   . 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 Dysrhythmia   . Anxiety    Past Surgical History  Procedure Laterality Date  . Tonsillectomy     Family History  Problem Relation Age of Onset  . Diabetes Maternal Aunt   . Diabetes Maternal Grandfather   . Asthma Sister    Social History  Substance Use Topics  . Smoking status: Former Games developer  . Smokeless tobacco: None  . Alcohol Use: Yes     Comment: occasional   OB History    Gravida Para Term Preterm AB TAB SAB Ectopic Multiple Living   3 3 3       3      Review of Systems  All other systems reviewed and are negative.     Allergies  Review of patient's allergies indicates no known allergies.  Home Medications   Prior to Admission medications   Medication Sig Start Date End Date Taking? Authorizing Provider  clonazePAM (KLONOPIN) 0.5 MG tablet Take 0.5 mg by mouth 2 (two) times daily as needed for anxiety.    Historical Provider, MD  cyclobenzaprine (FLEXERIL) 10 MG tablet Take 1 tablet (10 mg total) by mouth 2 (two) times daily as needed for muscle spasms. 10/04/12   Nicole Pisciotta, PA-C  norgestimate-ethinyl estradiol (ORTHO-CYCLEN,SPRINTEC,PREVIFEM)  0.25-35 MG-MCG tablet Take 1 tablet by mouth daily. 07/08/12   Deirdre C Poe, CNM  sulfamethoxazole-trimethoprim (SEPTRA DS) 800-160 MG per tablet Take 1 tablet by mouth every 12 (twelve) hours. 10/02/12   Kaitlyn Szekalski, PA-C  traMADol (ULTRAM) 50 MG tablet Take 1 tablet (50 mg total) by mouth every 6 (six) hours. 11/25/14   Linwood Dibbles, MD  valACYclovir (VALTREX) 1000 MG tablet Take 1 tablet (1,000 mg total) by mouth 3 (three) times daily. 11/25/14 12/09/14  Linwood Dibbles, MD   BP 143/66 mmHg  Pulse 80  Temp(Src) 98.2 F (36.8 C) (Oral)  Resp 16  SpO2 99% Physical Exam  Constitutional: She appears well-developed and well-nourished. No distress.  HENT:  Head: Normocephalic and atraumatic.  Right Ear: External ear normal.  Left Ear: External ear normal.  Ulcerations noted on the tip of her tongue, circular areas of erythema on the mucosal surface of the lips  Eyes: Conjunctivae are normal. Right eye exhibits no discharge. Left eye exhibits no discharge. No scleral icterus.  Neck: Normal range of motion. Neck supple. No tracheal deviation present.  Cardiovascular: Normal rate and regular rhythm.   Pulmonary/Chest: Effort normal and breath sounds normal. No stridor. No respiratory distress.  Musculoskeletal: She exhibits no edema.  Lymphadenopathy:    She has no cervical adenopathy.  Neurological:  She is alert. Cranial nerve deficit: no gross deficits.  Skin: Skin is warm and dry. No rash noted.  Psychiatric: She has a normal mood and affect.  Nursing note and vitals reviewed.   ED Course  Procedures (including critical care time) Labs Review Labs Reviewed - No data to display  Imaging Review No results found. I have personally reviewed and evaluated these images and lab results as part of my medical decision-making.   EKG Interpretation None      MDM   Final diagnoses:  Gingivostomatitis    consistent with a gingivostomatitis.  No evidence of allergic reaction. Does not  appear to be consistent with an impetigo. She does not having any difficulty with her airway.  Patient has taken Valtrex in the past. I will refill that prescription. Tramadol for pain.    Linwood Dibbles, MD 11/25/14 [phone]

## 2015-08-02 ENCOUNTER — Other Ambulatory Visit: Payer: Self-pay | Admitting: Family Medicine

## 2016-09-13 ENCOUNTER — Other Ambulatory Visit: Payer: Self-pay | Admitting: Surgery

## 2016-09-13 DIAGNOSIS — H0520 Unspecified exophthalmos: Secondary | ICD-10-CM

## 2016-09-13 DIAGNOSIS — H0510 Unspecified chronic inflammatory disorders of orbit: Secondary | ICD-10-CM

## 2016-09-15 ENCOUNTER — Encounter (HOSPITAL_COMMUNITY): Payer: Self-pay | Admitting: Emergency Medicine

## 2016-09-15 ENCOUNTER — Emergency Department (HOSPITAL_COMMUNITY): Payer: Medicaid Other

## 2016-09-15 DIAGNOSIS — F419 Anxiety disorder, unspecified: Secondary | ICD-10-CM | POA: Diagnosis not present

## 2016-09-15 DIAGNOSIS — Z87891 Personal history of nicotine dependence: Secondary | ICD-10-CM | POA: Diagnosis not present

## 2016-09-15 DIAGNOSIS — R079 Chest pain, unspecified: Secondary | ICD-10-CM | POA: Diagnosis present

## 2016-09-15 DIAGNOSIS — R0789 Other chest pain: Secondary | ICD-10-CM | POA: Diagnosis not present

## 2016-09-15 DIAGNOSIS — Z79899 Other long term (current) drug therapy: Secondary | ICD-10-CM | POA: Insufficient documentation

## 2016-09-15 LAB — BASIC METABOLIC PANEL
ANION GAP: 9 (ref 5–15)
BUN: 8 mg/dL (ref 6–20)
CHLORIDE: 103 mmol/L (ref 101–111)
CO2: 22 mmol/L (ref 22–32)
Calcium: 9.1 mg/dL (ref 8.9–10.3)
Creatinine, Ser: 0.71 mg/dL (ref 0.44–1.00)
GFR calc non Af Amer: >60 mL/min (ref >60)
GLUCOSE: 91 mg/dL (ref 65–99)
Potassium: 3.5 mmol/L (ref 3.5–5.1)
Sodium: 134 mmol/L — ABNORMAL LOW (ref 135–145)

## 2016-09-15 LAB — CBC
HEMATOCRIT: 43.5 % (ref 36.0–46.0)
HEMOGLOBIN: 14.6 g/dL (ref 12.0–15.0)
MCH: 29.5 pg (ref 26.0–34.0)
MCHC: 33.6 g/dL (ref 30.0–36.0)
MCV: 87.9 fL (ref 78.0–100.0)
Platelets: 224 10*3/uL (ref 150–400)
RBC: 4.95 MIL/uL (ref 3.87–5.11)
RDW: 12.3 % (ref 11.5–15.5)
WBC: 11 10*3/uL — ABNORMAL HIGH (ref 4.0–10.5)

## 2016-09-15 LAB — I-STAT TROPONIN, ED: Troponin i, poc: 0 ng/mL (ref 0.00–0.08)

## 2016-09-15 LAB — D-DIMER, QUANTITATIVE: D-Dimer, Quant: 0.36 ug/mL-FEU (ref 0.00–0.50)

## 2016-09-15 NOTE — ED Triage Notes (Signed)
Pt reports central CP present since this am, sharp in nature, non radiating. Pain worsens with movement and deep breath. A/OX4

## 2016-09-16 ENCOUNTER — Emergency Department (HOSPITAL_COMMUNITY)
Admission: EM | Admit: 2016-09-16 | Discharge: 2016-09-16 | Disposition: A | Discharge status: Home / Self Care | Payer: Medicaid Other | Attending: Emergency Medicine | Admitting: Emergency Medicine

## 2016-09-16 DIAGNOSIS — R0789 Other chest pain: Secondary | ICD-10-CM

## 2016-09-16 MED ORDER — KETOROLAC TROMETHAMINE 30 MG/ML IJ SOLN
30.0000 mg | Freq: Once | INTRAMUSCULAR | Status: AC
  Administered 2016-09-16: 30 mg via INTRAMUSCULAR
  Filled 2016-09-16: qty 1

## 2016-09-16 MED ORDER — NAPROXEN 500 MG PO TABS: 500.0000 mg | ORAL_TABLET | Freq: Two times a day (BID) | ORAL | 0 refills | Status: AC

## 2016-09-16 NOTE — ED Provider Notes (Signed)
[facility]-EMERGENCY DEPT Provider Note   CSN: [id number] Arrival date & time: 09/15/16  2028     History   Chief Complaint Chief Complaint  Patient presents with  . Chest Pain    HPI Susan Burgess is a 31 y.o. female with history of anxiety, dysrhythmia who presents with chest pain. Patient reports her chest pain began around 8:45 this morning when she woke up. Patient describes her pain as constant. It is worse with laying down, deep breaths, movement. She states that her symptoms are improved with sitting up. Patient reports that she slept on her stomach all night, which is very abnormal for her. She denies any heavy lifting. She denies any recent long trips, surgeries, cancer, new leg pain or swelling. Patient is on the Depo-Provera injection for birth control. No history of blood clots. Patient denies any shortness of breath, abdominal pain, vomiting, urinary symptoms. Patient states she has had mild nausea associated with more severe pain. She has not taken any medications for her symptoms at home. No family history of cardiac disease. Patient does not smoke, she quit around 6 years ago. She denies any recent illness or cough.  HPI  Past Medical History:  Diagnosis Date  . Anxiety   . Dysrhythmia   . Herpes simplex    "around her mouth"  . No pertinent past medical history     Patient Active Problem List   Diagnosis Date Noted  . Heart palpitations 07/08/2012  . Herpes simplex 07/08/2012  . OCP (oral contraceptive pills) initiation 07/08/2012    Past Surgical History:  Procedure Laterality Date  . TONSILLECTOMY      OB History    Gravida Para Term Preterm AB Living   3 3 3     3    SAB TAB Ectopic Multiple Live Births           3       Home Medications    Prior to Admission medications   Medication Sig Start Date End Date Taking? Authorizing Provider  clonazePAM (KLONOPIN) 0.5 MG tablet Take 0.5 mg by mouth 2 (two) times daily as needed for anxiety.     [provider]  cyclobenzaprine (FLEXERIL) 10 MG tablet Take 1 tablet (10 mg total) by mouth 2 (two) times daily as needed for muscle spasms. 10/04/12   Pisciotta, Joni ReiningNicole, PA-C  naproxen (NAPROSYN) 500 MG tablet Take 1 tablet (500 mg total) by mouth 2 (two) times daily. 09/16/16   Beverlyn Mcginness, Waylan BogaAlexandra M, PA-C  norgestimate-ethinyl estradiol (ORTHO-CYCLEN,SPRINTEC,PREVIFEM) 0.25-35 MG-MCG tablet Take 1 tablet by mouth daily. 07/08/12   Poe, Deirdre C, CNM  sulfamethoxazole-trimethoprim (SEPTRA DS) 800-160 MG per tablet Take 1 tablet by mouth every 12 (twelve) hours. 10/02/12   Szekalski, Kaitlyn, PA-C  traMADol (ULTRAM) 50 MG tablet Take 1 tablet (50 mg total) by mouth every 6 (six) hours. 11/25/14   Linwood DibblesKnapp, Jon, MD    Family History Family History  Problem Relation Age of Onset  . Asthma Sister   . Diabetes Maternal Aunt   . Diabetes Maternal Grandfather     Social History Social History  Substance Use Topics  . Smoking status: Former Games developermoker  . Smokeless tobacco: Not on file  . Alcohol use Yes     Comment: occasional     Allergies   Patient has no known allergies.   Review of Systems Review of Systems  Constitutional: Negative for chills, diaphoresis and fever.  HENT: Negative for facial swelling and sore throat.  Respiratory: Negative for cough and shortness of breath.   Cardiovascular: Positive for chest pain.  Gastrointestinal: Positive for nausea. Negative for abdominal pain and vomiting.  Genitourinary: Negative for dysuria.  Musculoskeletal: Negative for back pain.  Skin: Negative for rash and wound.  Neurological: Negative for headaches.  Psychiatric/Behavioral: The patient is not nervous/anxious.      Physical Exam Updated Vital Signs BP 126/70 (BP Location: Right Arm)   Pulse 84   Temp 99.1 F (37.3 C) (Oral)   Resp 17   Ht 5' (1.524 m)   Wt 97.5 kg (215 lb)   SpO2 98%   BMI 41.99 kg/m   Physical Exam  Constitutional: She appears well-developed and  well-nourished. No distress.  HENT:  Head: Normocephalic and atraumatic.  Mouth/Throat: Oropharynx is clear and moist. No oropharyngeal exudate.  Eyes: Conjunctivae are normal. Pupils are equal, round, and reactive to light. Right eye exhibits no discharge. Left eye exhibits no discharge. No scleral icterus.  Neck: Normal range of motion. Neck supple. No thyromegaly present.  Cardiovascular: Normal rate, regular rhythm, normal heart sounds and intact distal pulses.  Exam reveals no gallop and no friction rub.   No murmur heard. Pulmonary/Chest: Effort normal and breath sounds normal. No stridor. No respiratory distress. She has no wheezes. She has no rales. She exhibits tenderness.    Abdominal: Soft. Bowel sounds are normal. She exhibits no distension. There is no tenderness. There is no rebound and no guarding.  Musculoskeletal: She exhibits no edema.  Lymphadenopathy:    She has no cervical adenopathy.  Neurological: She is alert. Coordination normal.  Skin: Skin is warm and dry. No rash noted. She is not diaphoretic. No pallor.  Psychiatric: She has a normal mood and affect.  Nursing note and vitals reviewed.    ED Treatments / Results  Labs (all labs ordered are listed, but only abnormal results are displayed) Labs Reviewed  BASIC METABOLIC PANEL - Abnormal; Notable for the following:       Result Value   Sodium 134 (*)    All other components within normal limits  CBC - Abnormal; Notable for the following:    WBC 11.0 (*)    All other components within normal limits  D-DIMER, QUANTITATIVE (NOT AT [facility])  I-STAT TROPOININ, ED  I-STAT BETA HCG BLOOD, ED ([facility], [facility], [facility] ONLY)    EKG  EKG Interpretation  Date/Time:  Sunday September 15 2016 20:32:44 EDT Ventricular Rate:  103 PR Interval:  130 QRS Duration: 84 QT Interval:  350 QTC Calculation: 458 R Axis:   64 Text Interpretation:  Sinus tachycardia ST elevation, consider early repolarization Nonspecific ST abnormality  Abnormal ECG No significant change since last tracing Confirmed by Rochele Raring [phone]) on 09/16/2016 2:21:25 AM       Radiology Dg Chest 2 View  Result Date: 09/15/2016 CLINICAL DATA:  31 year old female with chest pain. EXAM: CHEST  2 VIEW COMPARISON:  Chest radiograph dated 03/08/2012 FINDINGS: The heart size and mediastinal contours are within normal limits. Both lungs are clear. The visualized skeletal structures are unremarkable. IMPRESSION: No active cardiopulmonary disease. Electronically Signed   By: Elgie Collard M.D.   On: 09/15/2016 22:13    Procedures Procedures (including critical care time)  Medications Ordered in ED Medications  ketorolac (TORADOL) 30 MG/ML injection 30 mg (30 mg Intramuscular Given 09/16/16 0245)     Initial Impression / Assessment and Plan / ED Course  I have reviewed the triage vital signs and the nursing  notes.  Pertinent labs & imaging results that were available during my care of the patient were reviewed by me and considered in my medical decision making (see chart for details).     CBC shows WBC 11, which is probably nonspecific. BMP shows sodium 134. Troponin negative. D-dimer 0.36. CXR shows no active cardiac pulmonary disease. EKG shows sinus tachycardia, ST elevation (probably early repole), nonspecific ST-T wave changes, but no significant changes from last tracing. Patient given IM Toradol in the ED with very good relief. Patient feeling well and requesting discharge home. Suspect costochondritis or other nonemergent cause. Patient discharged home with Naprosyn. Supportive treatment discussed. Strict return precautions given. Patient understands and agrees with plan. Patient vitals stable throughout ED course discharged in satisfactory condition. I discussed patient case with Dr. Elesa Massed who guided the patient's management and agrees with plan.   Final Clinical Impressions(s) / ED Diagnoses   Final diagnoses:  Chest wall pain    New  Prescriptions Discharge Medication List as of 09/16/2016  3:35 AM    START taking these medications   Details  naproxen (NAPROSYN) 500 MG tablet Take 1 tablet (500 mg total) by mouth 2 (two) times daily., Starting Mon 09/16/2016, [address] [place], PA-C 09/16/16 0353    Ward, Layla Maw, DO 09/16/16 1610

## 2016-09-16 NOTE — Discharge Instructions (Signed)
Medications: Naprosyn  Treatment: Take Naprosyn twice daily for your chest pain. You can use at icepack in the areas that hurt alternating 20 minutes on, 20 minutes off.  Follow-up: Please follow-up with your primary care provider if your symptoms persist. Please return to the emergency department if you develop any new or worsening symptoms.

## 2016-10-07 ENCOUNTER — Ambulatory Visit
Admission: RE | Admit: 2016-10-07 | Discharge: 2016-10-07 | Disposition: A | Discharge status: Home / Self Care | Payer: Medicaid Other | Source: Ambulatory Visit | Attending: Surgery | Admitting: Surgery

## 2016-10-07 DIAGNOSIS — H0520 Unspecified exophthalmos: Secondary | ICD-10-CM

## 2016-10-07 DIAGNOSIS — H0510 Unspecified chronic inflammatory disorders of orbit: Secondary | ICD-10-CM

## 2017-01-14 ENCOUNTER — Emergency Department (HOSPITAL_COMMUNITY)
Admission: EM | Admit: 2017-01-14 | Discharge: 2017-01-14 | Disposition: A | Discharge status: Home / Self Care | Payer: No Typology Code available for payment source | Attending: Emergency Medicine | Admitting: Emergency Medicine

## 2017-01-14 ENCOUNTER — Encounter (HOSPITAL_COMMUNITY): Payer: Self-pay

## 2017-01-14 ENCOUNTER — Emergency Department (HOSPITAL_COMMUNITY): Payer: No Typology Code available for payment source

## 2017-01-14 DIAGNOSIS — Z79899 Other long term (current) drug therapy: Secondary | ICD-10-CM | POA: Insufficient documentation

## 2017-01-14 DIAGNOSIS — Z791 Long term (current) use of non-steroidal anti-inflammatories (NSAID): Secondary | ICD-10-CM | POA: Diagnosis not present

## 2017-01-14 DIAGNOSIS — Z87891 Personal history of nicotine dependence: Secondary | ICD-10-CM | POA: Diagnosis not present

## 2017-01-14 DIAGNOSIS — M25512 Pain in left shoulder: Secondary | ICD-10-CM | POA: Insufficient documentation

## 2017-01-14 MED ORDER — CYCLOBENZAPRINE HCL 10 MG PO TABS: 10.0000 mg | ORAL_TABLET | Freq: Two times a day (BID) | ORAL | 0 refills | Status: AC | PRN

## 2017-01-14 MED ORDER — IBUPROFEN 800 MG PO TABS
800.0000 mg | ORAL_TABLET | Freq: Once | ORAL | Status: AC
  Administered 2017-01-14: 800 mg via ORAL
  Filled 2017-01-14: qty 1
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
## 2017-01-14 NOTE — ED Notes (Signed)
Patient verbalizes understanding of discharge instructions. Opportunity for questioning and answers were provided. 
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
## 2017-01-14 NOTE — ED Notes (Signed)
Pt brought back to room, clothing removed, ice provided for sore left shoulder

## 2017-01-14 NOTE — Discharge Instructions (Signed)
Please read attached information. If you experience any new or worsening signs or symptoms please return to the emergency room for evaluation. Please follow-up with your primary care provider or specialist as discussed. Please use medication prescribed only as directed and discontinue taking if you have any concerning signs or symptoms.   °
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
## 2017-01-14 NOTE — ED Notes (Signed)
PA bedside discussing test results and plan of care
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
